# Patient Record
Sex: Female | Born: 1950
Health system: Southern US, Community
[De-identification: ages and names within clinical notes are randomized; demographics above are authoritative.]

## PROBLEM LIST (undated history)

## (undated) DIAGNOSIS — E7800 Pure hypercholesterolemia, unspecified: Secondary | ICD-10-CM

## (undated) DIAGNOSIS — E785 Hyperlipidemia, unspecified: Secondary | ICD-10-CM

## (undated) DIAGNOSIS — K589 Irritable bowel syndrome without diarrhea: Secondary | ICD-10-CM

## (undated) DIAGNOSIS — F32A Depression, unspecified: Secondary | ICD-10-CM

## (undated) DIAGNOSIS — I10 Essential (primary) hypertension: Secondary | ICD-10-CM

## (undated) DIAGNOSIS — R251 Tremor, unspecified: Secondary | ICD-10-CM

## (undated) DIAGNOSIS — E559 Vitamin D deficiency, unspecified: Secondary | ICD-10-CM

## (undated) DIAGNOSIS — N189 Chronic kidney disease, unspecified: Secondary | ICD-10-CM

## (undated) DIAGNOSIS — L719 Rosacea, unspecified: Secondary | ICD-10-CM

## (undated) DIAGNOSIS — Z86018 Personal history of other benign neoplasm: Secondary | ICD-10-CM

## (undated) DIAGNOSIS — J90 Pleural effusion, not elsewhere classified: Secondary | ICD-10-CM

## (undated) DIAGNOSIS — J45909 Unspecified asthma, uncomplicated: Secondary | ICD-10-CM

## (undated) DIAGNOSIS — F329 Major depressive disorder, single episode, unspecified: Secondary | ICD-10-CM

## (undated) DIAGNOSIS — K219 Gastro-esophageal reflux disease without esophagitis: Secondary | ICD-10-CM

## (undated) HISTORY — PX: ABDOMINAL HYSTERECTOMY: SHX81

## (undated) HISTORY — PX: GANGLION CYST EXCISION: SHX1691

## (undated) HISTORY — PX: OTHER SURGICAL HISTORY: SHX169

## (undated) HISTORY — PX: TONSILLECTOMY: SUR1361

## (undated) HISTORY — PX: HERNIA REPAIR: SHX51

---

## 2004-03-24 ENCOUNTER — Ambulatory Visit: Payer: Self-pay | Admitting: Internal Medicine

## 2004-04-05 ENCOUNTER — Ambulatory Visit: Payer: Self-pay | Admitting: Internal Medicine

## 2005-03-16 ENCOUNTER — Ambulatory Visit: Payer: Self-pay | Admitting: Gastroenterology

## 2005-04-18 ENCOUNTER — Ambulatory Visit: Payer: Self-pay | Admitting: Internal Medicine

## 2006-05-03 ENCOUNTER — Ambulatory Visit: Payer: Self-pay | Admitting: Internal Medicine

## 2006-11-06 ENCOUNTER — Ambulatory Visit: Payer: Self-pay | Admitting: Internal Medicine

## 2007-01-25 ENCOUNTER — Ambulatory Visit: Payer: Self-pay | Admitting: Family Medicine

## 2007-02-13 ENCOUNTER — Ambulatory Visit: Payer: Self-pay | Admitting: Gastroenterology

## 2007-02-16 ENCOUNTER — Emergency Department: Payer: Self-pay | Admitting: Emergency Medicine

## 2007-05-06 ENCOUNTER — Ambulatory Visit: Payer: Self-pay | Admitting: Obstetrics and Gynecology

## 2008-04-19 ENCOUNTER — Emergency Department: Payer: Self-pay | Admitting: Internal Medicine

## 2008-05-07 ENCOUNTER — Ambulatory Visit: Payer: Self-pay | Admitting: Obstetrics and Gynecology

## 2009-01-12 ENCOUNTER — Emergency Department: Payer: Self-pay | Admitting: Emergency Medicine

## 2009-05-10 ENCOUNTER — Ambulatory Visit: Payer: Self-pay | Admitting: Internal Medicine

## 2009-11-18 ENCOUNTER — Ambulatory Visit: Payer: Self-pay | Admitting: Cardiology

## 2010-03-15 ENCOUNTER — Ambulatory Visit: Payer: Self-pay | Admitting: Gastroenterology

## 2010-05-12 ENCOUNTER — Ambulatory Visit: Payer: Self-pay | Admitting: Internal Medicine

## 2010-07-27 ENCOUNTER — Emergency Department: Payer: Self-pay | Admitting: Emergency Medicine

## 2011-05-24 ENCOUNTER — Ambulatory Visit: Payer: Self-pay | Admitting: Obstetrics and Gynecology

## 2011-06-12 ENCOUNTER — Ambulatory Visit: Payer: Self-pay

## 2011-06-12 LAB — RAPID STREP-A WITH REFLX: Micro Text Report: NEGATIVE

## 2011-06-14 LAB — BETA STREP CULTURE(ARMC)

## 2011-09-08 ENCOUNTER — Ambulatory Visit: Payer: Self-pay | Admitting: Orthopaedic Surgery

## 2011-09-08 ENCOUNTER — Emergency Department: Payer: Self-pay | Admitting: *Deleted

## 2011-09-08 LAB — BASIC METABOLIC PANEL
BUN: 17 mg/dL (ref 7–18)
Calcium, Total: 10.1 mg/dL (ref 8.5–10.1)
Creatinine: 1.01 mg/dL (ref 0.60–1.30)
EGFR (African American): >60
EGFR (Non-African Amer.): >60
Glucose: 122 mg/dL — ABNORMAL HIGH (ref 65–99)
Osmolality: 282 (ref 275–301)
Sodium: 140 mmol/L (ref 136–145)

## 2011-09-08 LAB — CBC
HCT: 41 % (ref 35.0–47.0)
HGB: 13.4 g/dL (ref 12.0–16.0)
MCHC: 32.7 g/dL (ref 32.0–36.0)
MCV: 85 fL (ref 80–100)
Platelet: 203 10*3/uL (ref 150–440)
RBC: 4.81 10*6/uL (ref 3.80–5.20)
RDW: 12.6 % (ref 11.5–14.5)
WBC: 11.5 10*3/uL — ABNORMAL HIGH (ref 3.6–11.0)

## 2013-03-12 ENCOUNTER — Ambulatory Visit: Payer: Self-pay

## 2013-05-13 ENCOUNTER — Ambulatory Visit: Payer: Self-pay | Admitting: Family Medicine

## 2013-09-03 DIAGNOSIS — E785 Hyperlipidemia, unspecified: Secondary | ICD-10-CM | POA: Insufficient documentation

## 2013-09-03 DIAGNOSIS — F3289 Other specified depressive episodes: Secondary | ICD-10-CM | POA: Insufficient documentation

## 2013-09-03 DIAGNOSIS — R259 Unspecified abnormal involuntary movements: Secondary | ICD-10-CM | POA: Insufficient documentation

## 2013-09-03 DIAGNOSIS — K589 Irritable bowel syndrome without diarrhea: Secondary | ICD-10-CM | POA: Insufficient documentation

## 2013-09-03 DIAGNOSIS — E559 Vitamin D deficiency, unspecified: Secondary | ICD-10-CM | POA: Insufficient documentation

## 2014-04-24 DIAGNOSIS — J45909 Unspecified asthma, uncomplicated: Secondary | ICD-10-CM | POA: Insufficient documentation

## 2014-05-28 ENCOUNTER — Ambulatory Visit: Payer: Self-pay | Admitting: Family Medicine

## 2014-08-09 NOTE — Consult Note (Signed)
Brief Consult Note: Diagnosis: Left elbow fracture dislocation.   Patient was seen by consultant.   Comments: Patient had closed reduction of left elbow, splinting and CT scan in ED. Post reduction the patient did not have any neuro or vascular deficits, unchanged from pre-reduction.   I spoke with Dr.  Dub Mikes at [facility].  She will follow up with him by calling his office to make an appointment.  The number is [phone] or [phone]. She was instructed to bring her CT scan.  She was aslo instructed that if she has any increasing pain or numbness, tingling or decreaseed motor function to return to the ER as soon as possible.  Please see full consult in ED chart for details of the exam, etc.  Electronic Signatures: Ria Comment (MD)  (Signed 25-May-13 00:09)  Authored: Brief Consult Note   Last Updated: 25-May-13 00:09 by Ria Comment (MD)

## 2014-09-27 ENCOUNTER — Emergency Department
Admission: EM | Admit: 2014-09-27 | Discharge: 2014-09-28 | Disposition: A | Discharge status: Home / Self Care | Payer: PPO | Attending: Emergency Medicine | Admitting: Emergency Medicine

## 2014-09-27 ENCOUNTER — Encounter: Payer: Self-pay | Admitting: *Deleted

## 2014-09-27 ENCOUNTER — Emergency Department: Payer: PPO

## 2014-09-27 DIAGNOSIS — R509 Fever, unspecified: Secondary | ICD-10-CM

## 2014-09-27 DIAGNOSIS — R1013 Epigastric pain: Secondary | ICD-10-CM

## 2014-09-27 DIAGNOSIS — N3001 Acute cystitis with hematuria: Secondary | ICD-10-CM

## 2014-09-27 DIAGNOSIS — R197 Diarrhea, unspecified: Secondary | ICD-10-CM | POA: Insufficient documentation

## 2014-09-27 DIAGNOSIS — R112 Nausea with vomiting, unspecified: Secondary | ICD-10-CM | POA: Diagnosis present

## 2014-09-27 HISTORY — DX: Tremor, unspecified: R25.1

## 2014-09-27 HISTORY — DX: Unspecified asthma, uncomplicated: J45.909

## 2014-09-27 LAB — CBC WITH DIFFERENTIAL/PLATELET
BASOS ABS: 0 10*3/uL (ref 0–0.1)
Basophils Relative: 1 %
EOS ABS: 0 10*3/uL (ref 0–0.7)
EOS PCT: 0 %
HCT: 41.9 % (ref 35.0–47.0)
Hemoglobin: 13.7 g/dL (ref 12.0–16.0)
Lymphocytes Relative: 9 %
Lymphs Abs: 0.7 10*3/uL — ABNORMAL LOW (ref 1.0–3.6)
MCH: 27.3 pg (ref 26.0–34.0)
MCHC: 32.8 g/dL (ref 32.0–36.0)
MCV: 83.3 fL (ref 80.0–100.0)
Monocytes Absolute: 0.4 10*3/uL (ref 0.2–0.9)
Monocytes Relative: 6 %
Neutro Abs: 6.7 10*3/uL — ABNORMAL HIGH (ref 1.4–6.5)
Neutrophils Relative %: 84 %
PLATELETS: 172 10*3/uL (ref 150–440)
RBC: 5.03 MIL/uL (ref 3.80–5.20)
RDW: 13.4 % (ref 11.5–14.5)
WBC: 7.9 10*3/uL (ref 3.6–11.0)

## 2014-09-27 LAB — COMPREHENSIVE METABOLIC PANEL
ALT: 33 U/L (ref 14–54)
AST: 31 U/L (ref 15–41)
Albumin: 4.4 g/dL (ref 3.5–5.0)
Alkaline Phosphatase: 61 U/L (ref 38–126)
Anion gap: 8 (ref 5–15)
BILIRUBIN TOTAL: 1 mg/dL (ref 0.3–1.2)
BUN: 17 mg/dL (ref 6–20)
CHLORIDE: 105 mmol/L (ref 101–111)
CO2: 27 mmol/L (ref 22–32)
CREATININE: 1.13 mg/dL — AB (ref 0.44–1.00)
Calcium: 10.7 mg/dL — ABNORMAL HIGH (ref 8.9–10.3)
GFR calc Af Amer: 59 mL/min — ABNORMAL LOW (ref >60)
GFR calc non Af Amer: 51 mL/min — ABNORMAL LOW (ref >60)
Glucose, Bld: 155 mg/dL — ABNORMAL HIGH (ref 65–99)
POTASSIUM: 4.7 mmol/L (ref 3.5–5.1)
Sodium: 140 mmol/L (ref 135–145)
TOTAL PROTEIN: 7.1 g/dL (ref 6.5–8.1)

## 2014-09-27 LAB — URINALYSIS COMPLETE WITH MICROSCOPIC (ARMC ONLY)
BILIRUBIN URINE: NEGATIVE
Glucose, UA: NEGATIVE mg/dL
HGB URINE DIPSTICK: NEGATIVE
NITRITE: NEGATIVE
Protein, ur: 100 mg/dL — AB
Specific Gravity, Urine: 1.028 (ref 1.005–1.030)
pH: 6 (ref 5.0–8.0)

## 2014-09-27 LAB — LIPASE, BLOOD: LIPASE: 33 U/L (ref 22–51)

## 2014-09-27 MED ORDER — IOHEXOL 300 MG/ML  SOLN
100.0000 mL | Freq: Once | INTRAMUSCULAR | Status: AC | PRN
  Administered 2014-09-27: 100 mL via INTRAVENOUS

## 2014-09-27 MED ORDER — MORPHINE SULFATE 4 MG/ML IJ SOLN
INTRAMUSCULAR | Status: AC
  Administered 2014-09-27: 4 mg via INTRAVENOUS
  Filled 2014-09-27: qty 1

## 2014-09-27 MED ORDER — IOHEXOL 240 MG/ML SOLN
25.0000 mL | Freq: Once | INTRAMUSCULAR | Status: AC | PRN
  Administered 2014-09-27: 25 mL via ORAL

## 2014-09-27 MED ORDER — ONDANSETRON HCL 4 MG/2ML IJ SOLN
4.0000 mg | Freq: Once | INTRAMUSCULAR | Status: AC
  Administered 2014-09-27: 4 mg via INTRAVENOUS

## 2014-09-27 MED ORDER — SODIUM CHLORIDE 0.9 % IV BOLUS (SEPSIS)
1000.0000 mL | Freq: Once | INTRAVENOUS | Status: AC
Start: 2014-09-27 — End: 2014-09-28
  Administered 2014-09-27: 1000 mL via INTRAVENOUS

## 2014-09-27 MED ORDER — MORPHINE SULFATE 4 MG/ML IJ SOLN
4.0000 mg | Freq: Once | INTRAMUSCULAR | Status: AC
  Administered 2014-09-27: 4 mg via INTRAVENOUS

## 2014-09-27 MED ORDER — ONDANSETRON HCL 4 MG/2ML IJ SOLN
INTRAMUSCULAR | Status: AC
  Administered 2014-09-27: 4 mg via INTRAVENOUS
  Filled 2014-09-27: qty 2

## 2014-09-27 NOTE — ED Notes (Signed)
Pt has abd pain with vomiting and diarrhea.  Sx began last night. Pt alert.  Pale.

## 2014-09-27 NOTE — ED Notes (Signed)
Patient transported to CT 

## 2014-09-27 NOTE — ED Provider Notes (Signed)
[facility] Emergency Department Provider Note  Time seen: 10:17 PM  I have reviewed the triage vital signs and the nursing notes.   HISTORY  Chief Complaint Emesis    HPI Christina Perez is a 64 y.o. female who presents to the emergency department with nausea, vomiting, and upper abdominal pain since last night. According to the patient she has had upper abdominal pain, nausea, vomiting and some loose stool since last night. Denies any bloody or black stool or vomit. Denies fever, denies dysuria. Patient states subjective fever at home, but did not measure a temperature. Patient describes her abdominal pain is moderate, dull/aching, exacerbated with vomiting. Patient has not attempted to eat, but unable to take in fluids due to nausea/vomiting.     History reviewed. No pertinent past medical history.  There are no active problems to display for this patient.   No past surgical history on file.  No current outpatient prescriptions on file.  Allergies Cephalosporins and Sulfa antibiotics  No family history on file.  Social History History  Substance Use Topics  . Smoking status: Never Smoker   . Smokeless tobacco: Not on file  . Alcohol Use: No    Review of Systems Constitutional: Negative for fever. Cardiovascular: Negative for chest pain. Respiratory: Negative for shortness of breath. Gastrointestinal: Positive upper abdominal pain, nausea, vomiting, diarrhea. Genitourinary: Negative for dysuria. Skin: Negative for rash. Neurological: Negative for headache  10-point ROS otherwise negative.  ____________________________________________   PHYSICAL EXAM:  VITAL SIGNS: ED Triage Vitals  Enc Vitals Group     BP 09/27/14 2024 109/68 mmHg     Pulse Rate 09/27/14 2024 95     Resp 09/27/14 2024 20     Temp 09/27/14 2024 100.7 F (38.2 C)     Temp Source 09/27/14 2024 Oral     SpO2 09/27/14 2024 97 %     Weight 09/27/14 2024 182 lb  (82.555 kg)     Height 09/27/14 2024 5\' 5"  (1.651 m)     Head Cir --      Peak Flow --      Pain Score 09/27/14 2026 10     Pain Loc --      Pain Edu? --      Excl. in [place]? --     Constitutional: Alert and oriented. Well appearing and in no distress. ENT   Head: Normocephalic and atraumatic.   Mouth/Throat: Mucous membranes are moist. Cardiovascular: Normal rate, regular rhythm. No murmur Respiratory: Normal respiratory effort without tachypnea nor retractions. Breath sounds are clear  Gastrointestinal: Soft, moderate epigastric tenderness to palpation. No right upper quadrant tenderness to palpation. No rebound or guarding, abdomen otherwise benign. No CVA tenderness palpation. Musculoskeletal: Nontender with normal range of motion in all extremities. Neurologic:  Normal speech and language. No gross focal neurologic deficits  Skin:  Skin is warm, dry and intact.  Psychiatric: Mood and affect are normal. Speech and behavior are normal.   ____________________________________________    RADIOLOGY  CT pending  ____________________________________________    INITIAL IMPRESSION / ASSESSMENT AND PLAN / ED COURSE  Pertinent labs & imaging results that were available during my care of the patient were reviewed by me and considered in my medical decision making (see chart for details).  Patient with upper abdominal pain, nausea, vomiting since last night. Mild/moderate epigastric tenderness palpation on exam. Temperature 100.7, vitals otherwise within normal limits. Labs are largely within normal limits, mild creatinine elevation likely due to decreased  fluid intake. Currently awaiting urinalysis. We will proceed with a CT scan to further evaluate given the patient's temperature and abdominal pain. We will treat pain, nausea, and IV hydrate in the meantime.  CT and urinalysis pending, patient care signed out to Dr. Dahlia Client. ____________________________________________   FINAL  CLINICAL IMPRESSION(S) / ED DIAGNOSES  Epigastric abdominal pain Fever Nausea and vomiting   Harvest Dark, MD 09/27/14 2318

## 2014-09-27 NOTE — ED Notes (Addendum)
Pt to ED from home c/o abd pain and n/v/d since last night.  Pt states abd pain to epigastric area tender to palpation described as "it just hurts".  Pt reports diarrhea starting around same time as nausea at 2300 last night with around 20 vomiting episodes and 15 times with diarrhea.  (+) gas.  Pt denies urinary symptoms or burning.  Pt lungs clear x4, VSS, A&Ox4, speaking in complete and coherent sentences and in NAD at this time.

## 2014-09-28 MED ORDER — LEVOFLOXACIN 750 MG PO TABS: 750.0000 mg | ORAL_TABLET | Freq: Every day | ORAL | Status: AC

## 2014-09-28 MED ORDER — LEVOFLOXACIN IN D5W 750 MG/150ML IV SOLN: 750.0000 mg | Freq: Once | INTRAVENOUS | Status: DC

## 2014-09-28 MED ORDER — LEVOFLOXACIN IN D5W 750 MG/150ML IV SOLN
INTRAVENOUS | Status: AC
  Filled 2014-09-28: qty 150

## 2014-09-28 MED ORDER — ONDANSETRON 4 MG PO TBDP: 4.0000 mg | ORAL_TABLET | Freq: Three times a day (TID) | ORAL | Status: DC | PRN

## 2014-09-28 NOTE — ED Provider Notes (Signed)
-----------------------------------------   2:51 AM on 09/28/2014 -----------------------------------------  Assuming care from Dr. Kerman Passey.  In short, Christina Perez is a 64 y.o. female with a chief complaint of Emesis .  Refer to the original H&P for additional details.  The current plan of care is to follow-up the results of the urinalysis and the CT scan.  The urinalysis shows too numerous to count white blood cells and rare bacteria, are 6-30 red blood cells per high power field.  CT scan: 3 small nodules in the left lung base, no acute process demonstrated within the abdomen and pelvis. No evidence of bowel obstruction or inflammation.  I did give the patient a dose of level floxacillin while in the emergency department as the patient has a sulfa and cephalosporin allergy. The patient will be discharged home to follow-up with her primary care physician for UTI. She has not had any further emesis while in the emergency department.   Loney Hering, MD 09/28/14 [phone]

## 2014-09-28 NOTE — ED Notes (Signed)
Pt on med. Hold.

## 2014-09-28 NOTE — Discharge Instructions (Signed)
Abdominal Pain Many things can cause abdominal pain. Usually, abdominal pain is not caused by a disease and will improve without treatment. It can often be observed and treated at home. Your health care provider will do a physical exam and possibly order blood tests and X-rays to help determine the seriousness of your pain. However, in many cases, more time must pass before a clear cause of the pain can be found. Before that point, your health care provider may not know if you need more testing or further treatment. HOME CARE INSTRUCTIONS  Monitor your abdominal pain for any changes. The following actions may help to alleviate any discomfort you are experiencing:  Only take over-the-counter or prescription medicines as directed by your health care provider.  Do not take laxatives unless directed to do so by your health care provider.  Try a clear liquid diet (broth, tea, or water) as directed by your health care provider. Slowly move to a bland diet as tolerated. SEEK MEDICAL CARE IF:  You have unexplained abdominal pain.  You have abdominal pain associated with nausea or diarrhea.  You have pain when you urinate or have a bowel movement.  You experience abdominal pain that wakes you in the night.  You have abdominal pain that is worsened or improved by eating food.  You have abdominal pain that is worsened with eating fatty foods.  You have a fever. SEEK IMMEDIATE MEDICAL CARE IF:   Your pain does not go away within 2 hours.  You keep throwing up (vomiting).  Your pain is felt only in portions of the abdomen, such as the right side or the left lower portion of the abdomen.  You pass bloody or black tarry stools. MAKE SURE YOU:  Understand these instructions.   Will watch your condition.   Will get help right away if you are not doing well or get worse.  Document Released: 01/11/2005 Document Revised: 04/08/2013 Document Reviewed: 12/11/2012 [facility] Patient Information  2015 [place], [place]. This information is not intended to replace advice given to you by your health care provider. Make sure you discuss any questions you have with your health care provider.  Fever, Adult A fever is a higher than normal body temperature. In an adult, an oral temperature around 98.6 F (37 C) is considered normal. A temperature of 100.4 F (38 C) or higher is generally considered a fever. Mild or moderate fevers generally have no long-term effects and often do not require treatment. Extreme fever (greater than or equal to 106 F or 41.1 C) can cause seizures. The sweating that may occur with repeated or prolonged fever may cause dehydration. Elderly people can develop confusion during a fever. A measured temperature can vary with:  Age.  Time of day.  Method of measurement (mouth, underarm, rectal, or ear). The fever is confirmed by taking a temperature with a thermometer. Temperatures can be taken different ways. Some methods are accurate and some are not.  An oral temperature is used most commonly. Electronic thermometers are fast and accurate.  An ear temperature will only be accurate if the thermometer is positioned as recommended by the manufacturer.  A rectal temperature is accurate and done for those adults who have a condition where an oral temperature cannot be taken.  An underarm (axillary) temperature is not accurate and not recommended. Fever is a symptom, not a disease.  CAUSES   Infections commonly cause fever.  Some noninfectious causes for fever include:  Some arthritis conditions.  Some  thyroid or adrenal gland conditions.  Some immune system conditions.  Some types of cancer.  A medicine reaction.  High doses of certain street drugs such as methamphetamine.  Dehydration.  Exposure to high outside or room temperatures.  Occasionally, the source of a fever cannot be determined. This is sometimes called a "fever of unknown origin"  (FUO).  Some situations may lead to a temporary rise in body temperature that may go away on its own. Examples are:  Childbirth.  Surgery.  Intense exercise. HOME CARE INSTRUCTIONS   Take appropriate medicines for fever. Follow dosing instructions carefully. If you use acetaminophen to reduce the fever, be careful to avoid taking other medicines that also contain acetaminophen. Do not take aspirin for a fever if you are younger than age 35. There is an association with Reye's syndrome. Reye's syndrome is a rare but potentially deadly disease.  If an infection is present and antibiotics have been prescribed, take them as directed. Finish them even if you start to feel better.  Rest as needed.  Maintain an adequate fluid intake. To prevent dehydration during an illness with prolonged or recurrent fever, you may need to drink extra fluid.Drink enough fluids to keep your urine clear or pale yellow.  Sponging or bathing with room temperature water may help reduce body temperature. Do not use ice water or alcohol sponge baths.  Dress comfortably, but do not over-bundle. SEEK MEDICAL CARE IF:   You are unable to keep fluids down.  You develop vomiting or diarrhea.  You are not feeling at least partly better after 3 days.  You develop new symptoms or problems. SEEK IMMEDIATE MEDICAL CARE IF:   You have shortness of breath or trouble breathing.  You develop excessive weakness.  You are dizzy or you faint.  You are extremely thirsty or you are making little or no urine.  You develop new pain that was not there before (such as in the head, neck, chest, back, or abdomen).  You have persistent vomiting and diarrhea for more than 1 to 2 days.  You develop a stiff neck or your eyes become sensitive to light.  You develop a skin rash.  You have a fever or persistent symptoms for more than 2 to 3 days.  You have a fever and your symptoms suddenly get worse. MAKE SURE YOU:    Understand these instructions.  Will watch your condition.  Will get help right away if you are not doing well or get worse. Document Released: 09/27/2000 Document Revised: 08/18/2013 Document Reviewed: 02/02/2011 [facility] Patient Information 2015 [place], [place]. This information is not intended to replace advice given to you by your health care provider. Make sure you discuss any questions you have with your health care provider.  Urinary Tract Infection Urinary tract infections (UTIs) can develop anywhere along your urinary tract. Your urinary tract is your body's drainage system for removing wastes and extra water. Your urinary tract includes two kidneys, two ureters, a bladder, and a urethra. Your kidneys are a pair of bean-shaped organs. Each kidney is about the size of your fist. They are located below your ribs, one on each side of your spine. CAUSES Infections are caused by microbes, which are microscopic organisms, including fungi, viruses, and bacteria. These organisms are so small that they can only be seen through a microscope. Bacteria are the microbes that most commonly cause UTIs. SYMPTOMS  Symptoms of UTIs may vary by age and gender of the patient and by the location  of the infection. Symptoms in young women typically include a frequent and intense urge to urinate and a painful, burning feeling in the bladder or urethra during urination. Older women and men are more likely to be tired, shaky, and weak and have muscle aches and abdominal pain. A fever may mean the infection is in your kidneys. Other symptoms of a kidney infection include pain in your back or sides below the ribs, nausea, and vomiting. DIAGNOSIS To diagnose a UTI, your caregiver will ask you about your symptoms. Your caregiver also will ask to provide a urine sample. The urine sample will be tested for bacteria and white blood cells. White blood cells are made by your body to help fight infection. TREATMENT   Typically, UTIs can be treated with medication. Because most UTIs are caused by a bacterial infection, they usually can be treated with the use of antibiotics. The choice of antibiotic and length of treatment depend on your symptoms and the type of bacteria causing your infection. HOME CARE INSTRUCTIONS  If you were prescribed antibiotics, take them exactly as your caregiver instructs you. Finish the medication even if you feel better after you have only taken some of the medication.  Drink enough water and fluids to keep your urine clear or pale yellow.  Avoid caffeine, tea, and carbonated beverages. They tend to irritate your bladder.  Empty your bladder often. Avoid holding urine for long periods of time.  Empty your bladder before and after sexual intercourse.  After a bowel movement, women should cleanse from front to back. Use each tissue only once. SEEK MEDICAL CARE IF:   You have back pain.  You develop a fever.  Your symptoms do not begin to resolve within 3 days. SEEK IMMEDIATE MEDICAL CARE IF:   You have severe back pain or lower abdominal pain.  You develop chills.  You have nausea or vomiting.  You have continued burning or discomfort with urination. MAKE SURE YOU:   Understand these instructions.  Will watch your condition.  Will get help right away if you are not doing well or get worse. Document Released: 01/11/2005 Document Revised: 10/03/2011 Document Reviewed: 05/12/2011 [facility] Patient Information 2015 [place], [place]. This information is not intended to replace advice given to you by your health care provider. Make sure you discuss any questions you have with your health care provider.

## 2015-02-19 ENCOUNTER — Emergency Department
Admission: EM | Admit: 2015-02-19 | Discharge: 2015-02-19 | Disposition: A | Discharge status: Home / Self Care | Payer: PPO | Attending: Student | Admitting: Student

## 2015-02-19 ENCOUNTER — Emergency Department: Payer: PPO

## 2015-02-19 ENCOUNTER — Encounter: Payer: Self-pay | Admitting: *Deleted

## 2015-02-19 DIAGNOSIS — R319 Hematuria, unspecified: Secondary | ICD-10-CM | POA: Diagnosis present

## 2015-02-19 DIAGNOSIS — Z7982 Long term (current) use of aspirin: Secondary | ICD-10-CM | POA: Insufficient documentation

## 2015-02-19 DIAGNOSIS — E119 Type 2 diabetes mellitus without complications: Secondary | ICD-10-CM | POA: Insufficient documentation

## 2015-02-19 DIAGNOSIS — I10 Essential (primary) hypertension: Secondary | ICD-10-CM | POA: Diagnosis not present

## 2015-02-19 DIAGNOSIS — Z79899 Other long term (current) drug therapy: Secondary | ICD-10-CM | POA: Diagnosis not present

## 2015-02-19 DIAGNOSIS — N390 Urinary tract infection, site not specified: Secondary | ICD-10-CM | POA: Insufficient documentation

## 2015-02-19 LAB — CBC WITH DIFFERENTIAL/PLATELET
Basophils Absolute: 0.1 10*3/uL (ref 0–0.1)
Basophils Relative: 1 %
Eosinophils Absolute: 0.2 10*3/uL (ref 0–0.7)
Eosinophils Relative: 2 %
HEMATOCRIT: 40.1 % (ref 35.0–47.0)
Hemoglobin: 13.4 g/dL (ref 12.0–16.0)
LYMPHS ABS: 2 10*3/uL (ref 1.0–3.6)
LYMPHS PCT: 18 %
MCH: 27.6 pg (ref 26.0–34.0)
MCHC: 33.5 g/dL (ref 32.0–36.0)
MCV: 82.4 fL (ref 80.0–100.0)
MONO ABS: 0.9 10*3/uL (ref 0.2–0.9)
MONOS PCT: 8 %
NEUTROS ABS: 7.9 10*3/uL — AB (ref 1.4–6.5)
Neutrophils Relative %: 71 %
Platelets: 184 10*3/uL (ref 150–440)
RBC: 4.86 MIL/uL (ref 3.80–5.20)
RDW: 13.3 % (ref 11.5–14.5)
WBC: 11 10*3/uL (ref 3.6–11.0)

## 2015-02-19 LAB — URINALYSIS COMPLETE WITH MICROSCOPIC (ARMC ONLY)
BILIRUBIN URINE: NEGATIVE
Bacteria, UA: NONE SEEN
GLUCOSE, UA: NEGATIVE mg/dL
KETONES UR: NEGATIVE mg/dL
NITRITE: NEGATIVE
Protein, ur: 100 mg/dL — AB
Specific Gravity, Urine: 1.016 (ref 1.005–1.030)
pH: 8 (ref 5.0–8.0)

## 2015-02-19 LAB — COMPREHENSIVE METABOLIC PANEL
ALBUMIN: 4.5 g/dL (ref 3.5–5.0)
ALT: 35 U/L (ref 14–54)
ANION GAP: 5 (ref 5–15)
AST: 25 U/L (ref 15–41)
Alkaline Phosphatase: 69 U/L (ref 38–126)
BILIRUBIN TOTAL: 0.8 mg/dL (ref 0.3–1.2)
BUN: 11 mg/dL (ref 6–20)
CO2: 28 mmol/L (ref 22–32)
Calcium: 10.5 mg/dL — ABNORMAL HIGH (ref 8.9–10.3)
Chloride: 108 mmol/L (ref 101–111)
Creatinine, Ser: 1.13 mg/dL — ABNORMAL HIGH (ref 0.44–1.00)
GFR calc Af Amer: 59 mL/min — ABNORMAL LOW (ref >60)
GFR calc non Af Amer: 51 mL/min — ABNORMAL LOW (ref >60)
Glucose, Bld: 110 mg/dL — ABNORMAL HIGH (ref 65–99)
POTASSIUM: 4.1 mmol/L (ref 3.5–5.1)
Sodium: 141 mmol/L (ref 135–145)
TOTAL PROTEIN: 7.1 g/dL (ref 6.5–8.1)

## 2015-02-19 LAB — LIPASE, BLOOD: LIPASE: 38 U/L (ref 11–51)

## 2015-02-19 MED ORDER — CIPROFLOXACIN HCL 500 MG PO TABS
500.0000 mg | ORAL_TABLET | Freq: Once | ORAL | Status: AC
  Administered 2015-02-19: 500 mg via ORAL
  Filled 2015-02-19: qty 1

## 2015-02-19 MED ORDER — IOHEXOL 300 MG/ML  SOLN
125.0000 mL | Freq: Once | INTRAMUSCULAR | Status: AC | PRN
  Administered 2015-02-19: 125 mL via INTRAVENOUS

## 2015-02-19 MED ORDER — ONDANSETRON HCL 4 MG/2ML IJ SOLN
4.0000 mg | Freq: Once | INTRAMUSCULAR | Status: DC
  Filled 2015-02-19: qty 2

## 2015-02-19 MED ORDER — CIPROFLOXACIN HCL 500 MG PO TABS: 500.0000 mg | ORAL_TABLET | Freq: Two times a day (BID) | ORAL | Status: AC

## 2015-02-19 MED ORDER — NITROFURANTOIN MONOHYD MACRO 100 MG PO CAPS: 100.0000 mg | ORAL_CAPSULE | Freq: Once | ORAL | Status: DC

## 2015-02-19 MED ORDER — MORPHINE SULFATE (PF) 4 MG/ML IV SOLN: 4.0000 mg | Freq: Once | INTRAVENOUS | Status: DC

## 2015-02-19 MED ORDER — ONDANSETRON HCL 4 MG/2ML IJ SOLN
4.0000 mg | Freq: Once | INTRAMUSCULAR | Status: AC
  Administered 2015-02-19: 4 mg via INTRAVENOUS

## 2015-02-19 MED ORDER — SODIUM CHLORIDE 0.9 % IV BOLUS (SEPSIS)
500.0000 mL | Freq: Once | INTRAVENOUS | Status: AC
  Administered 2015-02-19: 500 mL via INTRAVENOUS

## 2015-02-19 MED ORDER — MORPHINE SULFATE (PF) 4 MG/ML IV SOLN
4.0000 mg | Freq: Once | INTRAVENOUS | Status: AC
  Administered 2015-02-19: 4 mg via INTRAVENOUS
  Filled 2015-02-19: qty 1

## 2015-02-19 NOTE — ED Provider Notes (Signed)
[facility] Emergency Department Provider Note  ____________________________________________  Time seen: Approximately 8:19 PM  I have reviewed the triage vital signs and the nursing notes.   HISTORY  Chief Complaint Hematuria    HPI Christina Perez is a 64 y.o. female with history of CHF, diabetes, hypertension, asthma, tremor who presents for evaluation of hematuria, gradual onset yesterday, constant since onset, worsening today. She is also complaining of diffuse lower abdominal pain. No fevers or chills. No vomiting or diarrhea. No chest pain or different breathing. No modifying factors.   Past Medical History  Diagnosis Date  . Asthma   . Tremor     There are no active problems to display for this patient.   Past Surgical History  Procedure Laterality Date  . Abdominal hysterectomy    . Tonsillectomy    . Hernia repair      Current Outpatient Rx  Name  Route  Sig  Dispense  Refill  . aspirin 81 MG tablet   Oral   Take 81 mg by mouth daily.         . ondansetron (ZOFRAN ODT) 4 MG disintegrating tablet   Oral   Take 1 tablet (4 mg total) by mouth every 8 (eight) hours as needed for nausea or vomiting.   20 tablet   0   . propranolol (INDERAL) 80 MG tablet   Oral   Take 80 mg by mouth 3 (three) times daily.           Allergies Cephalosporins and Sulfa antibiotics  History reviewed. No pertinent family history.  Social History Social History  Substance Use Topics  . Smoking status: Never Smoker   . Smokeless tobacco: None  . Alcohol Use: No    Review of Systems Constitutional: No fever/chills Eyes: No visual changes. ENT: No sore throat. Cardiovascular: Denies chest pain. Respiratory: Denies shortness of breath. Gastrointestinal: + abdominal pain.  No nausea, no vomiting.  No diarrhea.  No constipation. Genitourinary: Negative for dysuria. Musculoskeletal: Negative for back pain. Skin: Negative for  rash. Neurological: Negative for headaches, focal weakness or numbness.  10-point ROS otherwise negative.  ____________________________________________   PHYSICAL EXAM:  VITAL SIGNS: ED Triage Vitals  Enc Vitals Group     BP 02/19/15 1938 130/57 mmHg     Pulse Rate 02/19/15 1938 58     Resp 02/19/15 1938 18     Temp 02/19/15 1938 98.8 F (37.1 C)     Temp Source 02/19/15 1938 Oral     SpO2 02/19/15 1938 99 %     Weight 02/19/15 1938 180 lb (81.647 kg)     Height 02/19/15 1938 5\' 5"  (1.651 m)     Head Cir --      Peak Flow --      Pain Score 02/19/15 1939 6     Pain Loc --      Pain Edu? --      Excl. in [place]? --     Constitutional: Alert and oriented. Well appearing and in no acute distress. Eyes: Conjunctivae are normal. PERRL. EOMI. Head: Atraumatic. Nose: No congestion/rhinnorhea. Mouth/Throat: Mucous membranes are moist.  Oropharynx non-erythematous. Neck: No stridor.   Cardiovascular: Normal rate, regular rhythm. Grossly normal heart sounds.  Good peripheral circulation. Respiratory: Normal respiratory effort.  No retractions. Lungs CTAB. Gastrointestinal: Diffuse lower abdominal tenderness with suprapubic firmness. No CVA tenderness. Genitourinary: External genitalia examined with nurse Kendall at bedside, no gross hematuria from the urethra and no vaginal bleeding.  Musculoskeletal: No lower extremity tenderness nor edema.  No joint effusions. Neurologic:  Normal speech and language. No gross focal neurologic deficits are appreciated. No gait instability. Skin:  Skin is warm, dry and intact. No rash noted. Psychiatric: Mood and affect are normal. Speech and behavior are normal.  ____________________________________________   LABS (all labs ordered are listed, but only abnormal results are displayed)  Labs Reviewed  URINALYSIS COMPLETEWITH MICROSCOPIC ([facility] ONLY) - Abnormal; Notable for the following:    Color, Urine YELLOW (*)    APPearance CLOUDY (*)    Hgb  urine dipstick 3+ (*)    Protein, ur 100 (*)    Leukocytes, UA 3+ (*)    Squamous Epithelial / LPF 0-5 (*)    All other components within normal limits  CBC WITH DIFFERENTIAL/PLATELET - Abnormal; Notable for the following:    Neutro Abs 7.9 (*)    All other components within normal limits  COMPREHENSIVE METABOLIC PANEL - Abnormal; Notable for the following:    Glucose, Bld 110 (*)    Creatinine, Ser 1.13 (*)    Calcium 10.5 (*)    GFR calc non Af Amer 51 (*)    GFR calc Af Amer 59 (*)    All other components within normal limits  LIPASE, BLOOD   ____________________________________________  EKG  none ____________________________________________  RADIOLOGY  CT abdomen FINDINGS: The precontrast portion of the examination is negative for urinary calculus. Following administration of intravenous contrast, the scan was repeated. Multiphasic post-contrast scans demonstrate normal appearances of the liver, gallbladder, pancreas, spleen, adrenals and kidneys. Urinary bladder is unremarkable. The abdominal aorta is normal in caliber. There is mild atherosclerotic calcification. There is no adenopathy in the abdomen or pelvis. There are normal appearances of the stomach, small bowel and colon. The appendix is normal. There is prior hysterectomy. No adnexal abnormalities.  There is a small pericardial effusion, incompletely imaged but probably not significantly different from 09/27/2014. Minimal benign-appearing nodularity is also unchanged in the lung bases. Stable linear scarring again noted in the bases.  IMPRESSION: 1. Etiology of hematuria not identified. No significant abnormality is evident in the abdomen or pelvis. 2. Small incompletely imaged pericardial effusion. Mild benign appearing nodularity in the lung bases, 3 mm or less. If the patient is at high risk for bronchogenic carcinoma, follow-up chest CT at 1 year is recommended. If the patient is at low risk, no  follow-up is needed. This recommendation follows the consensus statement: Guidelines for Management of Small Pulmonary Nodules Detected on CT Scans: A Statement from the [employer] as published in Radiology 2005; 237:395-400.  ____________________________________________   PROCEDURES  Procedure(s) performed: None  Critical Care performed: No  ____________________________________________   INITIAL IMPRESSION / ASSESSMENT AND PLAN / ED COURSE  Pertinent labs & imaging results that were available during my care of the patient were reviewed by me and considered in my medical decision making (see chart for details).  Christina Perez is a 64 y.o. female with history of CHF, diabetes, hypertension, asthma, tremor, s/p total hysterectomy who presents for evaluation of hematuria and lower abdominal pain. On exam, she is generally nontoxic appearing and in no acute distress but she does have diffuse lower abdominal tenderness as well as some suprapubic firmness/distention. We'll obtain abdominal pain labs, treat her pain, return urinalysis and obtain CT of the abdomen and pelvis to evaluate for any evidence of malignancy/tumor.  ----------------------------------------- 10:41 PM on 02/19/2015 ----------------------------------------- Patient with significant improvement in her pain at this  time. Labs reviewed. CMP notable for very mild creatinine elevation at 1.13, fluids given. CBC unremarkable. Normal lipase. Urinalysis is consistent with urinary tract infection. CT scan shows no significant normality in the abdomen or pelvis/chronic findings when compared to CT scan earlier this year. Patient with no abdominal tenderness/firmness at this time. Discussed return precautions, need for close PCP follow-up, treatment with ciprofloxacin given allergies to sulfa and cephalosporins and she is comfortable with discharge plan.  ____________________________________________   FINAL CLINICAL  IMPRESSION(S) / ED DIAGNOSES  Final diagnoses:  Hematuria  UTI (lower urinary tract infection)      Joanne Gavel, MD 02/19/15 2243

## 2015-02-19 NOTE — ED Notes (Signed)
Edd Fabian, MD and RN at bedside.

## 2015-02-19 NOTE — ED Notes (Signed)
Pt reports hematuria and abdominal pain that started yesterday.  States that now she is bleeding from her urethra continuously and is changing her pad 2-3x an hour.  Pt reports that she is passing small clots.   Pt appears weak and pale.

## 2015-04-22 DIAGNOSIS — K219 Gastro-esophageal reflux disease without esophagitis: Secondary | ICD-10-CM | POA: Diagnosis not present

## 2015-04-22 DIAGNOSIS — E7800 Pure hypercholesterolemia, unspecified: Secondary | ICD-10-CM | POA: Diagnosis not present

## 2015-04-22 DIAGNOSIS — R739 Hyperglycemia, unspecified: Secondary | ICD-10-CM | POA: Diagnosis not present

## 2015-04-22 DIAGNOSIS — Z79899 Other long term (current) drug therapy: Secondary | ICD-10-CM | POA: Diagnosis not present

## 2015-04-22 DIAGNOSIS — G250 Essential tremor: Secondary | ICD-10-CM | POA: Diagnosis not present

## 2015-04-22 DIAGNOSIS — J4520 Mild intermittent asthma, uncomplicated: Secondary | ICD-10-CM | POA: Diagnosis not present

## 2015-04-22 DIAGNOSIS — J302 Other seasonal allergic rhinitis: Secondary | ICD-10-CM | POA: Diagnosis not present

## 2015-04-23 DIAGNOSIS — E7800 Pure hypercholesterolemia, unspecified: Secondary | ICD-10-CM | POA: Diagnosis not present

## 2015-04-23 DIAGNOSIS — Z79899 Other long term (current) drug therapy: Secondary | ICD-10-CM | POA: Diagnosis not present

## 2015-04-23 DIAGNOSIS — R739 Hyperglycemia, unspecified: Secondary | ICD-10-CM | POA: Diagnosis not present

## 2015-07-06 ENCOUNTER — Emergency Department: Payer: PPO

## 2015-07-06 ENCOUNTER — Encounter: Payer: Self-pay | Admitting: *Deleted

## 2015-07-06 DIAGNOSIS — R05 Cough: Secondary | ICD-10-CM | POA: Diagnosis not present

## 2015-07-06 DIAGNOSIS — Z79899 Other long term (current) drug therapy: Secondary | ICD-10-CM | POA: Insufficient documentation

## 2015-07-06 DIAGNOSIS — J40 Bronchitis, not specified as acute or chronic: Secondary | ICD-10-CM | POA: Diagnosis not present

## 2015-07-06 DIAGNOSIS — J45901 Unspecified asthma with (acute) exacerbation: Secondary | ICD-10-CM | POA: Diagnosis not present

## 2015-07-06 DIAGNOSIS — Z7982 Long term (current) use of aspirin: Secondary | ICD-10-CM | POA: Diagnosis not present

## 2015-07-06 DIAGNOSIS — R0789 Other chest pain: Secondary | ICD-10-CM | POA: Diagnosis not present

## 2015-07-06 DIAGNOSIS — R0602 Shortness of breath: Secondary | ICD-10-CM | POA: Diagnosis not present

## 2015-07-06 LAB — CBC
HCT: 42 % (ref 35.0–47.0)
HEMOGLOBIN: 14.1 g/dL (ref 12.0–16.0)
MCH: 27.3 pg (ref 26.0–34.0)
MCHC: 33.5 g/dL (ref 32.0–36.0)
MCV: 81.5 fL (ref 80.0–100.0)
Platelets: 203 10*3/uL (ref 150–440)
RBC: 5.16 MIL/uL (ref 3.80–5.20)
RDW: 13.8 % (ref 11.5–14.5)
WBC: 6.7 10*3/uL (ref 3.6–11.0)

## 2015-07-06 LAB — BASIC METABOLIC PANEL
ANION GAP: 8 (ref 5–15)
BUN: 16 mg/dL (ref 6–20)
CO2: 24 mmol/L (ref 22–32)
Calcium: 11 mg/dL — ABNORMAL HIGH (ref 8.9–10.3)
Chloride: 105 mmol/L (ref 101–111)
Creatinine, Ser: 1.12 mg/dL — ABNORMAL HIGH (ref 0.44–1.00)
GFR calc Af Amer: 59 mL/min — ABNORMAL LOW (ref >60)
GFR, EST NON AFRICAN AMERICAN: 51 mL/min — AB (ref >60)
Glucose, Bld: 98 mg/dL (ref 65–99)
POTASSIUM: 3.7 mmol/L (ref 3.5–5.1)
SODIUM: 137 mmol/L (ref 135–145)

## 2015-07-06 LAB — TROPONIN I

## 2015-07-06 NOTE — ED Notes (Signed)
Pt to triage via wheelchair.  Pt reports she has cold sx and husband was dx with flu.  Today pt reports sob with chest heaviness.  Nonproductive cough.  Nonsmoker.  No n/v/d  No diaphoresis.  Pt alert.  Speech clear.

## 2015-07-07 ENCOUNTER — Emergency Department
Admission: EM | Admit: 2015-07-07 | Discharge: 2015-07-07 | Disposition: A | Discharge status: Home / Self Care | Payer: PPO | Attending: Emergency Medicine | Admitting: Emergency Medicine

## 2015-07-07 DIAGNOSIS — J40 Bronchitis, not specified as acute or chronic: Secondary | ICD-10-CM

## 2015-07-07 MED ORDER — HYDROCOD POLST-CPM POLST ER 10-8 MG/5ML PO SUER: 5.0000 mL | Freq: Two times a day (BID) | ORAL | Status: DC

## 2015-07-07 MED ORDER — PREDNISONE 20 MG PO TABS: ORAL_TABLET | ORAL | Status: DC

## 2015-07-07 MED ORDER — HYDROCOD POLST-CPM POLST ER 10-8 MG/5ML PO SUER
5.0000 mL | Freq: Once | ORAL | Status: AC
  Administered 2015-07-07: 5 mL via ORAL
  Filled 2015-07-07: qty 5

## 2015-07-07 NOTE — Discharge Instructions (Signed)
1. Take steroid as prescribed (Prednisone 60mg  daily x 5 days). 2. Use cough medicine as needed (Tussionex). 3. Return to the ER for worsening symptoms, persistent vomiting, difficulty breathing or other concerns.   Upper Respiratory Infection, Adult Most upper respiratory infections (URIs) are a viral infection of the air passages leading to the lungs. A URI affects the nose, throat, and upper air passages. The most common type of URI is nasopharyngitis and is typically referred to as "the common cold." URIs run their course and usually go away on their own. Most of the time, a URI does not require medical attention, but sometimes a bacterial infection in the upper airways can follow a viral infection. This is called a secondary infection. Sinus and middle ear infections are common types of secondary upper respiratory infections. Bacterial pneumonia can also complicate a URI. A URI can worsen asthma and chronic obstructive pulmonary disease (COPD). Sometimes, these complications can require emergency medical care and may be life threatening.  CAUSES Almost all URIs are caused by viruses. A virus is a type of germ and can spread from one person to another.  RISKS FACTORS You may be at risk for a URI if:   You smoke.   You have chronic heart or lung disease.  You have a weakened defense (immune) system.   You are very young or very old.   You have nasal allergies or asthma.  You work in crowded or poorly ventilated areas.  You work in health care facilities or schools. SIGNS AND SYMPTOMS  Symptoms typically develop 2-3 days after you come in contact with a cold virus. Most viral URIs last 7-10 days. However, viral URIs from the influenza virus (flu virus) can last 14-18 days and are typically more severe. Symptoms may include:   Runny or stuffy (congested) nose.   Sneezing.   Cough.   Sore throat.   Headache.   Fatigue.   Fever.   Loss of appetite.   Pain in  your forehead, behind your eyes, and over your cheekbones (sinus pain).  Muscle aches.  DIAGNOSIS  Your health care provider may diagnose a URI by:  Physical exam.  Tests to check that your symptoms are not due to another condition such as:  Strep throat.  Sinusitis.  Pneumonia.  Asthma. TREATMENT  A URI goes away on its own with time. It cannot be cured with medicines, but medicines may be prescribed or recommended to relieve symptoms. Medicines may help:  Reduce your fever.  Reduce your cough.  Relieve nasal congestion. HOME CARE INSTRUCTIONS   Take medicines only as directed by your health care provider.   Gargle warm saltwater or take cough drops to comfort your throat as directed by your health care provider.  Use a warm mist humidifier or inhale steam from a shower to increase air moisture. This may make it easier to breathe.  Drink enough fluid to keep your urine clear or pale yellow.   Eat soups and other clear broths and maintain good nutrition.   Rest as needed.   Return to work when your temperature has returned to normal or as your health care provider advises. You may need to stay home longer to avoid infecting others. You can also use a face mask and careful hand washing to prevent spread of the virus.  Increase the usage of your inhaler if you have asthma.   Do not use any tobacco products, including cigarettes, chewing tobacco, or electronic cigarettes. If you need  help quitting, ask your health care provider. PREVENTION  The best way to protect yourself from getting a cold is to practice good hygiene.   Avoid oral or hand contact with people with cold symptoms.   Wash your hands often if contact occurs.  There is no clear evidence that vitamin C, vitamin E, echinacea, or exercise reduces the chance of developing a cold. However, it is always recommended to get plenty of rest, exercise, and practice good nutrition.  SEEK MEDICAL CARE IF:    You are getting worse rather than better.   Your symptoms are not controlled by medicine.   You have chills.  You have worsening shortness of breath.  You have brown or red mucus.  You have yellow or brown nasal discharge.  You have pain in your face, especially when you bend forward.  You have a fever.  You have swollen neck glands.  You have pain while swallowing.  You have white areas in the back of your throat. SEEK IMMEDIATE MEDICAL CARE IF:   You have severe or persistent:  Headache.  Ear pain.  Sinus pain.  Chest pain.  You have chronic lung disease and any of the following:  Wheezing.  Prolonged cough.  Coughing up blood.  A change in your usual mucus.  You have a stiff neck.  You have changes in your:  Vision.  Hearing.  Thinking.  Mood. MAKE SURE YOU:   Understand these instructions.  Will watch your condition.  Will get help right away if you are not doing well or get worse.   This information is not intended to replace advice given to you by your health care provider. Make sure you discuss any questions you have with your health care provider.   Document Released: 09/27/2000 Document Revised: 08/18/2014 Document Reviewed: 07/09/2013 Elsevier Interactive Patient Education [employer].

## 2015-07-07 NOTE — ED Notes (Signed)
Pt states she feels like someone is sitting on her chest, states she has asthma, non-smoker. Pt states all together today she has used her inhaler 5 times. Pt is in no distress at this time, laying in stretcher. Family at bedside.

## 2015-07-07 NOTE — ED Provider Notes (Signed)
[facility] Emergency Department Provider Note  ____________________________________________  Time seen: Approximately 2:01 AM  I have reviewed the triage vital signs and the nursing notes.   HISTORY  Chief Complaint Shortness of Breath    HPI Christina Perez is a 65 y.o. female who presents to the ED from home with a chief complaint of shortness of breath. Patient reports cold symptoms for several days; husband was hospitalized with flu last week. Complains of nonproductive cough, shortness of breath, wheezing associated with chest tightness. Denies associated fever, chills, abdominal pain, nausea, vomiting, diarrhea.Denies recent travel or trauma. Used her albuterol inhaler 5 times over the course of the day. Nothing makes her symptoms better or worse.   Past Medical History  Diagnosis Date  . Asthma   . Tremor     There are no active problems to display for this patient.   Past Surgical History  Procedure Laterality Date  . Abdominal hysterectomy    . Tonsillectomy    . Hernia repair      Current Outpatient Rx  Name  Route  Sig  Dispense  Refill  . albuterol (PROVENTIL HFA) 108 (90 Base) MCG/ACT inhaler   Inhalation   Inhale 2 puffs into the lungs every 6 (six) hours as needed.         Marland Kitchen aspirin 81 MG tablet   Oral   Take 81 mg by mouth daily.         . ondansetron (ZOFRAN ODT) 4 MG disintegrating tablet   Oral   Take 1 tablet (4 mg total) by mouth every 8 (eight) hours as needed for nausea or vomiting.   20 tablet   0   . propranolol (INDERAL) 80 MG tablet   Oral   Take 80 mg by mouth 3 (three) times daily.           Allergies Cephalosporins and Sulfa antibiotics  No family history on file.  Social History Social History  Substance Use Topics  . Smoking status: Never Smoker   . Smokeless tobacco: None  . Alcohol Use: No    Review of Systems  Constitutional: No fever/chills Eyes: No visual changes. ENT: No sore  throat. Cardiovascular: Positive for chest tightness. Respiratory: Positive for wheezing, nonproductive cough and shortness of breath. Gastrointestinal: No abdominal pain.  No nausea, no vomiting.  No diarrhea.  No constipation. Genitourinary: Negative for dysuria. Musculoskeletal: Negative for back pain. Skin: Negative for rash. Neurological: Negative for headaches, focal weakness or numbness.  10-point ROS otherwise negative.  ____________________________________________   PHYSICAL EXAM:  VITAL SIGNS: ED Triage Vitals  Enc Vitals Group     BP 07/06/15 2110 141/63 mmHg     Pulse Rate 07/06/15 2110 75     Resp 07/06/15 2110 20     Temp 07/06/15 2110 98.4 F (36.9 C)     Temp Source 07/06/15 2110 Oral     SpO2 07/06/15 2110 100 %     Weight 07/06/15 2110 182 lb (82.555 kg)     Height 07/06/15 2110 5\' 5"  (1.651 m)     Head Cir --      Peak Flow --      Pain Score 07/06/15 2111 7     Pain Loc --      Pain Edu? --      Excl. in [place]? --     Constitutional: Alert and oriented. Well appearing and in no acute distress. Eyes: Conjunctivae are normal. PERRL. EOMI. Head: Atraumatic. Nose:  No congestion/rhinnorhea. Mouth/Throat: Mucous membranes are moist.  Oropharynx non-erythematous. Neck: No stridor.   Cardiovascular: Normal rate, regular rhythm. Grossly normal heart sounds.  Good peripheral circulation. Respiratory: Normal respiratory effort.  No retractions. Lungs CTAB. No wheezing. Good aeration. Gastrointestinal: Soft and nontender. No distention. No abdominal bruits. No CVA tenderness. Musculoskeletal: No lower extremity tenderness nor edema.  No joint effusions. Neurologic:  Normal speech and language. No gross focal neurologic deficits are appreciated. No gait instability. Skin:  Skin is warm, dry and intact. No rash noted. Psychiatric: Mood and affect are normal. Speech and behavior are normal.  ____________________________________________   LABS (all labs ordered  are listed, but only abnormal results are displayed)  Labs Reviewed  BASIC METABOLIC PANEL - Abnormal; Notable for the following:    Creatinine, Ser 1.12 (*)    Calcium 11.0 (*)    GFR calc non Af Amer 51 (*)    GFR calc Af Amer 59 (*)    All other components within normal limits  TROPONIN I  CBC   ____________________________________________  EKG  ED ECG REPORT I, SUNG,JADE J, the attending physician, personally viewed and interpreted this ECG.   Date: 07/07/2015  EKG Time: 2115  Rate: 70  Rhythm: normal EKG, normal sinus rhythm  Axis: Normal  Intervals:none  ST&T Change: Nonspecific  ____________________________________________  RADIOLOGY  Chest 2 view (viewed by me, interpreted per Dr. Quintella Reichert): No acute cardiopulmonary disease. ____________________________________________   PROCEDURES  Procedure(s) performed: None  Critical Care performed: No  ____________________________________________   INITIAL IMPRESSION / ASSESSMENT AND PLAN / ED COURSE  Pertinent labs & imaging results that were available during my care of the patient were reviewed by me and considered in my medical decision making (see chart for details).  66 year old female with a history of asthma who presents with cold symptoms 1 week. Currently lungs are clear to auscultation; nonproductive cough noted. Chest x-ray is clear. Will initiate prednisone, tussionex and follow-up with her PCP. Strict return precautions given. Patient and family verbalize understanding and agree with plan of care. ____________________________________________   FINAL CLINICAL IMPRESSION(S) / ED DIAGNOSES  Final diagnoses:  Bronchitis      Paulette Blanch, MD 07/07/15 [phone]

## 2015-07-09 DIAGNOSIS — J209 Acute bronchitis, unspecified: Secondary | ICD-10-CM | POA: Diagnosis not present

## 2015-08-30 DIAGNOSIS — Z961 Presence of intraocular lens: Secondary | ICD-10-CM | POA: Diagnosis not present

## 2015-08-30 DIAGNOSIS — H35373 Puckering of macula, bilateral: Secondary | ICD-10-CM | POA: Diagnosis not present

## 2015-10-22 DIAGNOSIS — Z79899 Other long term (current) drug therapy: Secondary | ICD-10-CM | POA: Diagnosis not present

## 2015-10-22 DIAGNOSIS — E7800 Pure hypercholesterolemia, unspecified: Secondary | ICD-10-CM | POA: Diagnosis not present

## 2015-10-22 DIAGNOSIS — R739 Hyperglycemia, unspecified: Secondary | ICD-10-CM | POA: Diagnosis not present

## 2015-11-16 DIAGNOSIS — R739 Hyperglycemia, unspecified: Secondary | ICD-10-CM | POA: Diagnosis not present

## 2015-11-16 DIAGNOSIS — E7800 Pure hypercholesterolemia, unspecified: Secondary | ICD-10-CM | POA: Diagnosis not present

## 2015-11-16 DIAGNOSIS — Z0000 Encounter for general adult medical examination without abnormal findings: Secondary | ICD-10-CM | POA: Diagnosis not present

## 2015-11-16 DIAGNOSIS — Z1211 Encounter for screening for malignant neoplasm of colon: Secondary | ICD-10-CM | POA: Diagnosis not present

## 2015-11-16 DIAGNOSIS — Z79899 Other long term (current) drug therapy: Secondary | ICD-10-CM | POA: Diagnosis not present

## 2015-11-17 ENCOUNTER — Other Ambulatory Visit: Payer: Self-pay | Admitting: Family Medicine

## 2015-11-17 DIAGNOSIS — Z1231 Encounter for screening mammogram for malignant neoplasm of breast: Secondary | ICD-10-CM

## 2015-11-29 ENCOUNTER — Ambulatory Visit: Payer: PPO

## 2015-12-01 ENCOUNTER — Ambulatory Visit
Admission: RE | Admit: 2015-12-01 | Discharge: 2015-12-01 | Disposition: A | Discharge status: Home / Self Care | Payer: PPO | Source: Ambulatory Visit | Attending: Family Medicine | Admitting: Family Medicine

## 2015-12-01 ENCOUNTER — Other Ambulatory Visit: Payer: Self-pay | Admitting: Family Medicine

## 2015-12-01 DIAGNOSIS — Z1231 Encounter for screening mammogram for malignant neoplasm of breast: Secondary | ICD-10-CM

## 2015-12-02 DIAGNOSIS — Z86010 Personal history of colonic polyps: Secondary | ICD-10-CM | POA: Diagnosis not present

## 2016-01-13 DIAGNOSIS — Z23 Encounter for immunization: Secondary | ICD-10-CM | POA: Diagnosis not present

## 2016-02-08 ENCOUNTER — Encounter: Payer: Self-pay | Admitting: *Deleted

## 2016-02-09 ENCOUNTER — Ambulatory Visit: Payer: PPO | Admitting: Anesthesiology

## 2016-02-09 ENCOUNTER — Ambulatory Visit
Admission: RE | Admit: 2016-02-09 | Discharge: 2016-02-09 | Disposition: A | Discharge status: Home / Self Care | Payer: PPO | Source: Ambulatory Visit | Attending: Unknown Physician Specialty | Admitting: Unknown Physician Specialty

## 2016-02-09 ENCOUNTER — Encounter: Payer: Self-pay | Admitting: Unknown Physician Specialty

## 2016-02-09 ENCOUNTER — Encounter
Admission: RE | Disposition: A | Discharge status: Home / Self Care | Payer: Self-pay | Source: Ambulatory Visit | Attending: Unknown Physician Specialty

## 2016-02-09 DIAGNOSIS — K219 Gastro-esophageal reflux disease without esophagitis: Secondary | ICD-10-CM | POA: Insufficient documentation

## 2016-02-09 DIAGNOSIS — D123 Benign neoplasm of transverse colon: Secondary | ICD-10-CM | POA: Insufficient documentation

## 2016-02-09 DIAGNOSIS — Z1211 Encounter for screening for malignant neoplasm of colon: Secondary | ICD-10-CM | POA: Insufficient documentation

## 2016-02-09 DIAGNOSIS — Z86010 Personal history of colonic polyps: Secondary | ICD-10-CM | POA: Diagnosis not present

## 2016-02-09 DIAGNOSIS — K648 Other hemorrhoids: Secondary | ICD-10-CM | POA: Diagnosis not present

## 2016-02-09 DIAGNOSIS — I10 Essential (primary) hypertension: Secondary | ICD-10-CM | POA: Insufficient documentation

## 2016-02-09 DIAGNOSIS — J45909 Unspecified asthma, uncomplicated: Secondary | ICD-10-CM | POA: Insufficient documentation

## 2016-02-09 DIAGNOSIS — K5730 Diverticulosis of large intestine without perforation or abscess without bleeding: Secondary | ICD-10-CM | POA: Diagnosis not present

## 2016-02-09 DIAGNOSIS — Z7982 Long term (current) use of aspirin: Secondary | ICD-10-CM | POA: Insufficient documentation

## 2016-02-09 DIAGNOSIS — Z79899 Other long term (current) drug therapy: Secondary | ICD-10-CM | POA: Insufficient documentation

## 2016-02-09 DIAGNOSIS — K635 Polyp of colon: Secondary | ICD-10-CM | POA: Diagnosis not present

## 2016-02-09 DIAGNOSIS — E7800 Pure hypercholesterolemia, unspecified: Secondary | ICD-10-CM | POA: Diagnosis not present

## 2016-02-09 DIAGNOSIS — F329 Major depressive disorder, single episode, unspecified: Secondary | ICD-10-CM | POA: Insufficient documentation

## 2016-02-09 DIAGNOSIS — K640 First degree hemorrhoids: Secondary | ICD-10-CM | POA: Diagnosis not present

## 2016-02-09 DIAGNOSIS — K5790 Diverticulosis of intestine, part unspecified, without perforation or abscess without bleeding: Secondary | ICD-10-CM | POA: Diagnosis not present

## 2016-02-09 HISTORY — DX: Irritable bowel syndrome, unspecified: K58.9

## 2016-02-09 HISTORY — DX: Rosacea, unspecified: L71.9

## 2016-02-09 HISTORY — DX: Major depressive disorder, single episode, unspecified: F32.9

## 2016-02-09 HISTORY — DX: Pure hypercholesterolemia, unspecified: E78.00

## 2016-02-09 HISTORY — DX: Depression, unspecified: F32.A

## 2016-02-09 HISTORY — DX: Gastro-esophageal reflux disease without esophagitis: K21.9

## 2016-02-09 HISTORY — DX: Tremor, unspecified: R25.1

## 2016-02-09 HISTORY — DX: Pleural effusion, not elsewhere classified: J90

## 2016-02-09 HISTORY — DX: Personal history of other benign neoplasm: Z86.018

## 2016-02-09 HISTORY — PX: COLONOSCOPY WITH PROPOFOL: SHX5780

## 2016-02-09 HISTORY — DX: Essential (primary) hypertension: I10

## 2016-02-09 SURGERY — COLONOSCOPY WITH PROPOFOL
Anesthesia: General

## 2016-02-09 MED ORDER — LIDOCAINE HCL (PF) 1 % IJ SOLN
2.0000 mL | Freq: Once | INTRAMUSCULAR | Status: AC
  Administered 2016-02-09: 0.3 mL via INTRADERMAL

## 2016-02-09 MED ORDER — SODIUM CHLORIDE 0.9 % IV SOLN
INTRAVENOUS | Status: DC
  Administered 2016-02-09: 10:00:00 via INTRAVENOUS

## 2016-02-09 MED ORDER — SODIUM CHLORIDE 0.9 % IV SOLN: INTRAVENOUS | Status: DC

## 2016-02-09 MED ORDER — FENTANYL CITRATE (PF) 100 MCG/2ML IJ SOLN
INTRAMUSCULAR | Status: DC | PRN
  Administered 2016-02-09: 50 ug via INTRAVENOUS

## 2016-02-09 MED ORDER — LIDOCAINE 2% (20 MG/ML) 5 ML SYRINGE
INTRAMUSCULAR | Status: DC | PRN
  Administered 2016-02-09: 40 mg via INTRAVENOUS

## 2016-02-09 MED ORDER — MIDAZOLAM HCL 5 MG/5ML IJ SOLN
INTRAMUSCULAR | Status: DC | PRN
  Administered 2016-02-09: 1 mg via INTRAVENOUS

## 2016-02-09 MED ORDER — PHENYLEPHRINE HCL 10 MG/ML IJ SOLN
INTRAMUSCULAR | Status: DC | PRN
  Administered 2016-02-09: 100 ug via INTRAVENOUS

## 2016-02-09 MED ORDER — LIDOCAINE HCL (PF) 1 % IJ SOLN
INTRAMUSCULAR | Status: AC
  Administered 2016-02-09: 0.3 mL via INTRADERMAL
  Filled 2016-02-09: qty 2

## 2016-02-09 MED ORDER — PROPOFOL 10 MG/ML IV BOLUS
INTRAVENOUS | Status: DC | PRN
  Administered 2016-02-09: 50 mg via INTRAVENOUS

## 2016-02-09 MED ORDER — PROPOFOL 500 MG/50ML IV EMUL
INTRAVENOUS | Status: DC | PRN
  Administered 2016-02-09: 140 ug/kg/min via INTRAVENOUS

## 2016-02-09 NOTE — Op Note (Signed)
[facility] Patient Name: Christina Perez Procedure Date: 02/09/2016 10:01 AM MRN: [id number] Account #: [id number] Date of Birth: [birth year] Admit Type: Outpatient Age: 65 Room: [facility] ENDO ROOM 4 Gender: Female Note Status: Finalized Procedure:            Colonoscopy Indications:          High risk colon cancer surveillance: Personal history                        of colonic polyps Providers:            Manya Silvas, MD Referring MD:         Caprice Renshaw MD (Referring MD) Medicines:            Propofol per Anesthesia Complications:        No immediate complications. Procedure:            Pre-Anesthesia Assessment:                       - After reviewing the risks and benefits, the patient                        was deemed in satisfactory condition to undergo the                        procedure.                       After obtaining informed consent, the colonoscope was                        passed under direct vision. Throughout the procedure,                        the patient's blood pressure, pulse, and oxygen                        saturations were monitored continuously. The                        Colonoscope was introduced through the anus and                        advanced to the the cecum, identified by appendiceal                        orifice and ileocecal valve. The colonoscopy was                        performed without difficulty. The patient tolerated the                        procedure well. The quality of the bowel preparation                        was excellent. Findings:      A diminutive polyp was found in the proximal transverse colon. The polyp       was sessile. The polyp was removed with a jumbo cold forceps. Resection       and retrieval were complete.      A few small-mouthed  diverticula were found in the sigmoid colon.      Internal hemorrhoids were found during endoscopy. The hemorrhoids were       small  and Grade I (internal hemorrhoids that do not prolapse).      The exam was otherwise without abnormality. Impression:           - One diminutive polyp in the proximal transverse                        colon, removed with a jumbo cold forceps. Resected and                        retrieved.                       - Diverticulosis in the sigmoid colon.                       - Internal hemorrhoids.                       - The examination was otherwise normal. Recommendation:       - Await pathology results. Manya Silvas, MD 02/09/2016 10:31:00 AM This report has been signed electronically. Number of Addenda: 0 Note Initiated On: 02/09/2016 10:01 AM Scope Withdrawal Time: 0 hours 10 minutes 18 seconds  Total Procedure Duration: 0 hours 14 minutes 53 seconds       [facility]

## 2016-02-09 NOTE — H&P (Signed)
Primary Care Physician:  Marcello Fennel, MD Primary Gastroenterologist:  Dr. Vira Agar  Pre-Procedure History & Physical: HPI:  Christina Perez is a 65 y.o. female is here for an colonoscopy.   Past Medical History:  Diagnosis Date  . Asthma   . Depression   . GERD (gastroesophageal reflux disease)   . High cholesterol   . History of uterine leiomyoma   . Hypertension   . IBS (irritable bowel syndrome)   . Pleural effusion   . Rosacea   . Tremor   . Tremors of nervous system     Past Surgical History:  Procedure Laterality Date  . ABDOMINAL HYSTERECTOMY    . colon polyps    . GANGLION CYST EXCISION    . HERNIA REPAIR    . TONSILLECTOMY      Prior to Admission medications   Medication Sig Start Date End Date Taking? Authorizing Provider  escitalopram (LEXAPRO) 20 MG tablet Take 20 mg by mouth daily.   Yes Historical Provider, MD  fluticasone (FLONASE) 50 MCG/ACT nasal spray 2 sprays daily.   Yes Historical Provider, MD  hyoscyamine (LEVSIN SL) 0.125 MG SL tablet Place 0.125 mg under the tongue every 4 (four) hours as needed.   Yes Historical Provider, MD  omeprazole (PRILOSEC) 20 MG capsule Take 20 mg by mouth daily.   Yes Historical Provider, MD  ranitidine (ZANTAC) 150 MG capsule Take 150 mg by mouth every evening.   Yes Historical Provider, MD  simvastatin (ZOCOR) 20 MG tablet Take 20 mg by mouth daily.   Yes Historical Provider, MD  albuterol (PROVENTIL HFA) 108 (90 Base) MCG/ACT inhaler Inhale 2 puffs into the lungs every 6 (six) hours as needed.    Historical Provider, MD  aspirin 81 MG tablet Take 81 mg by mouth daily.    Historical Provider, MD  chlorpheniramine-HYDROcodone (TUSSIONEX PENNKINETIC ER) 10-8 MG/5ML SUER Take 5 mLs by mouth 2 (two) times daily. 07/07/15   Paulette Blanch, MD  ondansetron (ZOFRAN ODT) 4 MG disintegrating tablet Take 1 tablet (4 mg total) by mouth every 8 (eight) hours as needed for nausea or vomiting. 09/28/14   Loney Hering, MD   predniSONE (DELTASONE) 20 MG tablet 3 tablets daily x 5 days 07/07/15   Paulette Blanch, MD  propranolol (INDERAL) 80 MG tablet Take 80 mg by mouth 3 (three) times daily.    Historical Provider, MD    Allergies as of 01/25/2016 - Review Complete 07/06/2015  Allergen Reaction Noted  . Cephalosporins Rash 09/27/2014  . Sulfa antibiotics Rash 09/27/2014    No family history on file.  Social History   Social History  . Marital status: Married    Spouse name: N/A  . Number of children: N/A  . Years of education: N/A   Occupational History  . Not on file.   Social History Main Topics  . Smoking status: Never Smoker  . Smokeless tobacco: Not on file  . Alcohol use No  . Drug use: No  . Sexual activity: Not on file   Other Topics Concern  . Not on file   Social History Narrative  . No narrative on file    Review of Systems: See HPI, otherwise negative ROS  Physical Exam: BP 117/83   Pulse 86   Temp 97.3 F (36.3 C) (Tympanic)   Resp 17   Ht 5\' 5"  (1.651 m)   Wt 81.6 kg (180 lb)   SpO2 100%   BMI 29.95 kg/m  General:   Alert,  pleasant and cooperative in NAD Head:  Normocephalic and atraumatic. Neck:  Supple; no masses or thyromegaly. Lungs:  Clear throughout to auscultation.    Heart:  Regular rate and rhythm. Abdomen:  Soft, nontender and nondistended. Normal bowel sounds, without guarding, and without rebound.   Neurologic:  Alert and  oriented x4;  grossly normal neurologically.  Impression/Plan: Christina Perez is here for an colonoscopy to be performed for [facility] colon polyps  Risks, benefits, limitations, and alternatives regarding  colonoscopy have been reviewed with the patient.  Questions have been answered.  All parties agreeable.   Gaylyn Cheers, MD  02/09/2016, 10:06 AM

## 2016-02-09 NOTE — Transfer of Care (Signed)
Immediate Anesthesia Transfer of Care Note  Patient: Christina Perez  Procedure(s) Performed: Procedure(s): COLONOSCOPY WITH PROPOFOL (N/A)  Patient Location: PACU and Endoscopy Unit  Anesthesia Type:General  Level of Consciousness: awake, oriented and patient cooperative  Airway & Oxygen Therapy: Patient Spontanous Breathing and Patient connected to nasal cannula oxygen  Post-op Assessment: Report given to RN and Post -op Vital signs reviewed and stable  Post vital signs: Reviewed and stable  Last Vitals:  Vitals:   02/09/16 0931  BP: 117/83  Pulse: 86  Resp: 17  Temp: 36.3 C    Last Pain:  Vitals:   02/09/16 0931  TempSrc: Tympanic         Complications: No apparent anesthesia complications

## 2016-02-09 NOTE — Anesthesia Postprocedure Evaluation (Signed)
Anesthesia Post Note  Patient: Christina Perez  Procedure(s) Performed: Procedure(s) (LRB): COLONOSCOPY WITH PROPOFOL (N/A)  Patient location during evaluation: PACU Anesthesia Type: General Level of consciousness: awake Pain management: pain level controlled Vital Signs Assessment: post-procedure vital signs reviewed and stable Respiratory status: spontaneous breathing Cardiovascular status: stable Anesthetic complications: no    Last Vitals:  Vitals:   02/09/16 1030 02/09/16 1035  BP:  (!) 83/53  Pulse: 69 69  Resp: 17 18  Temp: 36.6 C 36.6 C    Last Pain:  Vitals:   02/09/16 1030  TempSrc: Tympanic                 VAN STAVEREN,Rumor Sun

## 2016-02-09 NOTE — Anesthesia Preprocedure Evaluation (Signed)
Anesthesia Evaluation  Patient identified by MRN, date of birth, ID band Patient awake    Reviewed: Allergy & Precautions, NPO status , Patient's Chart, lab work & pertinent test results  Airway Mallampati: II       Dental  (+) Teeth Intact   Pulmonary asthma ,    breath sounds clear to auscultation       Cardiovascular hypertension, Pt. on medications  Rhythm:Regular Rate:Normal     Neuro/Psych Depression    GI/Hepatic Neg liver ROS, GERD  Medicated,  Endo/Other  negative endocrine ROS  Renal/GU negative Renal ROS     Musculoskeletal   Abdominal Normal abdominal exam  (+)   Peds  Hematology negative hematology ROS (+)   Anesthesia Other Findings   Reproductive/Obstetrics                             Anesthesia Physical Anesthesia Plan  ASA: II  Anesthesia Plan: General   Post-op Pain Management:    Induction: Intravenous  Airway Management Planned: Natural Airway and Nasal Cannula  Additional Equipment:   Intra-op Plan:   Post-operative Plan:   Informed Consent: I have reviewed the patients History and Physical, chart, labs and discussed the procedure including the risks, benefits and alternatives for the proposed anesthesia with the patient or authorized representative who has indicated his/her understanding and acceptance.     Plan Discussed with: CRNA  Anesthesia Plan Comments:         Anesthesia Quick Evaluation

## 2016-02-10 LAB — SURGICAL PATHOLOGY

## 2016-02-29 DIAGNOSIS — H1031 Unspecified acute conjunctivitis, right eye: Secondary | ICD-10-CM | POA: Diagnosis not present

## 2016-05-11 DIAGNOSIS — E7800 Pure hypercholesterolemia, unspecified: Secondary | ICD-10-CM | POA: Diagnosis not present

## 2016-05-11 DIAGNOSIS — Z79899 Other long term (current) drug therapy: Secondary | ICD-10-CM | POA: Diagnosis not present

## 2016-05-11 DIAGNOSIS — R739 Hyperglycemia, unspecified: Secondary | ICD-10-CM | POA: Diagnosis not present

## 2016-05-18 DIAGNOSIS — R1013 Epigastric pain: Secondary | ICD-10-CM | POA: Diagnosis not present

## 2016-05-18 DIAGNOSIS — F419 Anxiety disorder, unspecified: Secondary | ICD-10-CM | POA: Diagnosis not present

## 2016-05-18 DIAGNOSIS — E7800 Pure hypercholesterolemia, unspecified: Secondary | ICD-10-CM | POA: Diagnosis not present

## 2016-05-18 DIAGNOSIS — R739 Hyperglycemia, unspecified: Secondary | ICD-10-CM | POA: Diagnosis not present

## 2016-05-18 DIAGNOSIS — Z79899 Other long term (current) drug therapy: Secondary | ICD-10-CM | POA: Diagnosis not present

## 2016-05-18 DIAGNOSIS — K219 Gastro-esophageal reflux disease without esophagitis: Secondary | ICD-10-CM | POA: Diagnosis not present

## 2016-05-18 DIAGNOSIS — J4540 Moderate persistent asthma, uncomplicated: Secondary | ICD-10-CM | POA: Diagnosis not present

## 2016-06-06 DIAGNOSIS — K219 Gastro-esophageal reflux disease without esophagitis: Secondary | ICD-10-CM | POA: Diagnosis not present

## 2016-06-09 IMAGING — CT CT ABD-PELV W/ CM
1 of 3 series · 14 of 32 positions shown, 19 images · IV contrast (omnipaque)
Comparison: None.

CLINICAL DATA: Epigastric pain since last night. Vomiting and
diarrhea.

EXAM:
CT ABDOMEN AND PELVIS WITH CONTRAST
TECHNIQUE: Multidetector CT imaging of the abdomen and pelvis was performed
using the standard protocol following bolus administration of
intravenous contrast.
CONTRAST:  100mL OMNIPAQUE IOHEXOL 300 MG/ML  SOLN

[Series 2: routine abd pel with · axial · 0.68mm/px · z∈[-333,+107]mm · 14 of 100 slices shown, 19 images]
[im 6/100  soft-tissue]
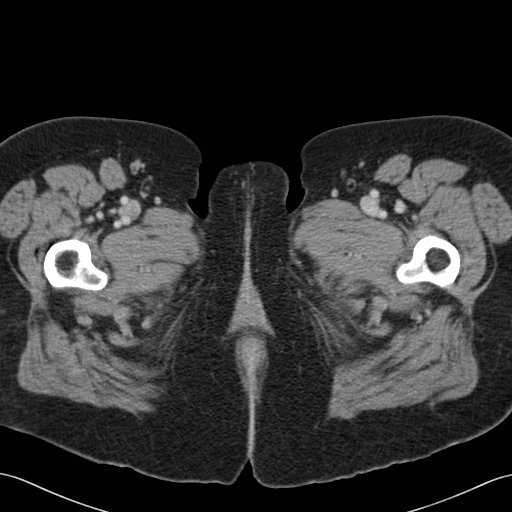
[im 6/100  bone]
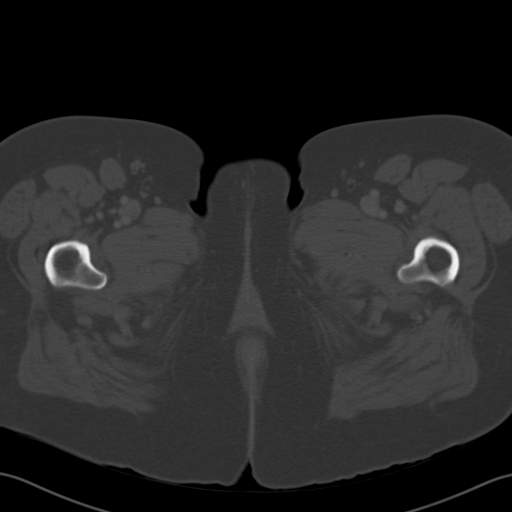
[im 16/100  soft-tissue]
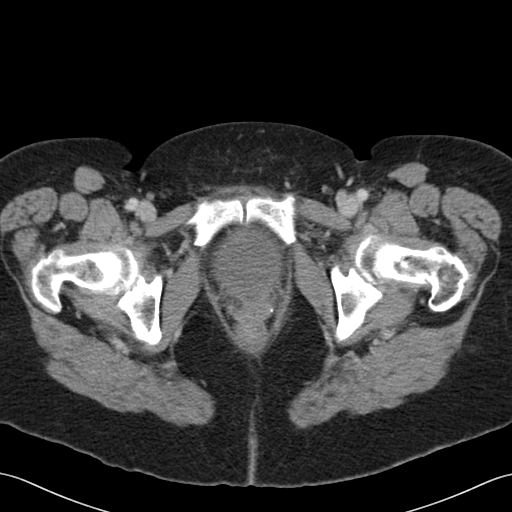
[im 21/100  soft-tissue]
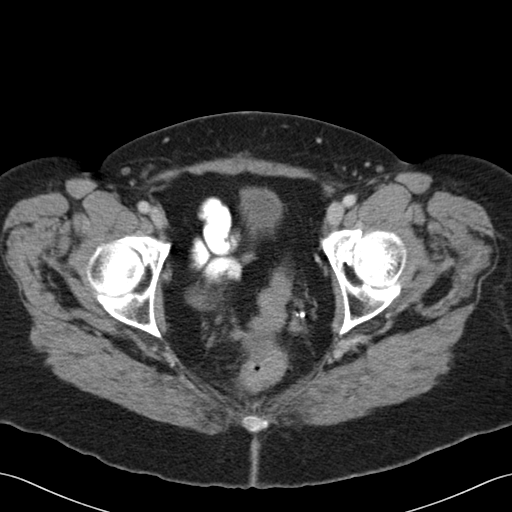
[im 27/100  soft-tissue]
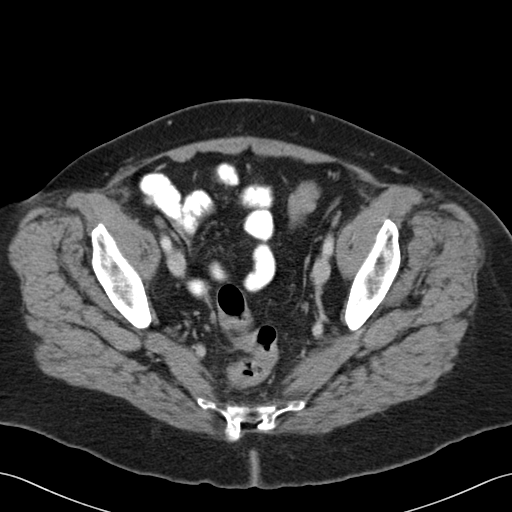
[im 37/100  soft-tissue]
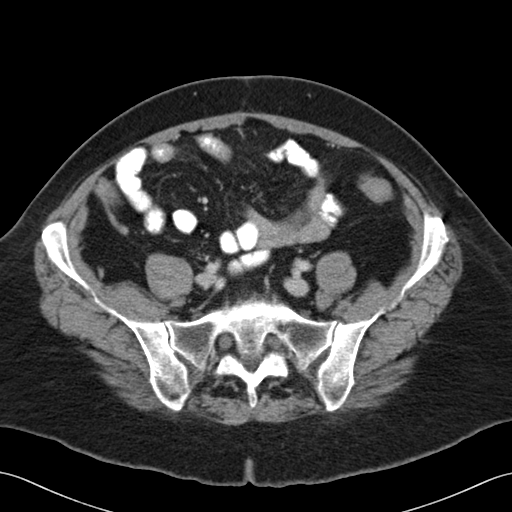
[im 42/100  soft-tissue]
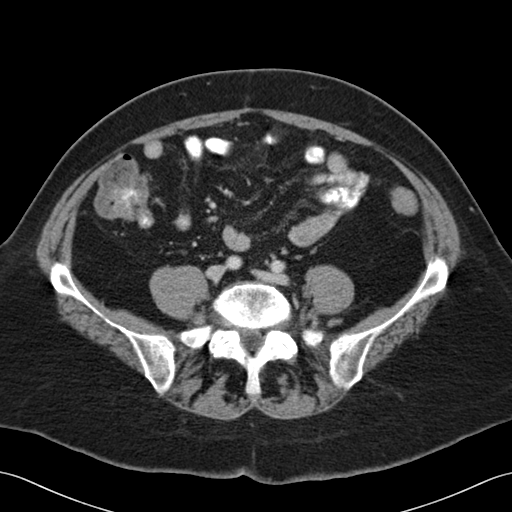
[im 53/100  soft-tissue]
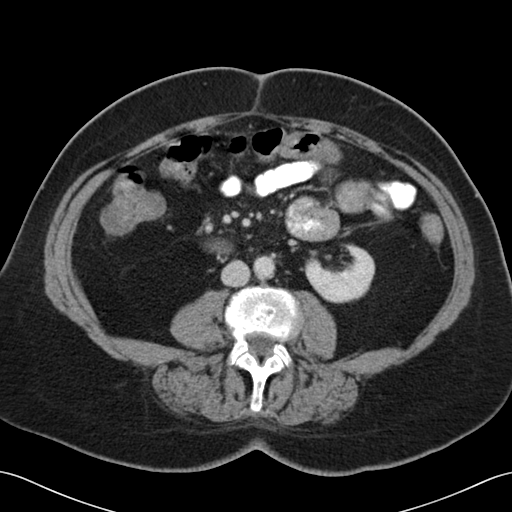
[im 58/100  soft-tissue]
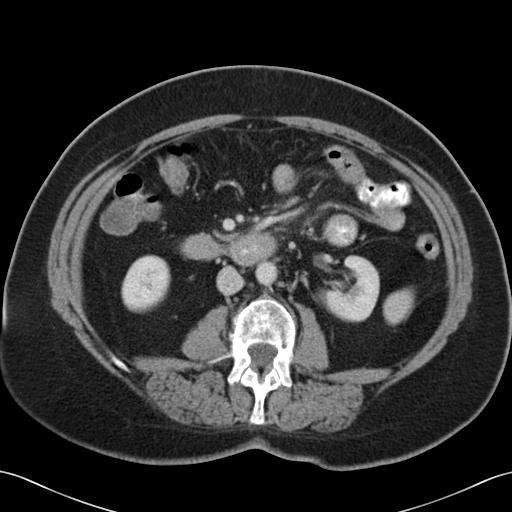
[im 63/100  soft-tissue]
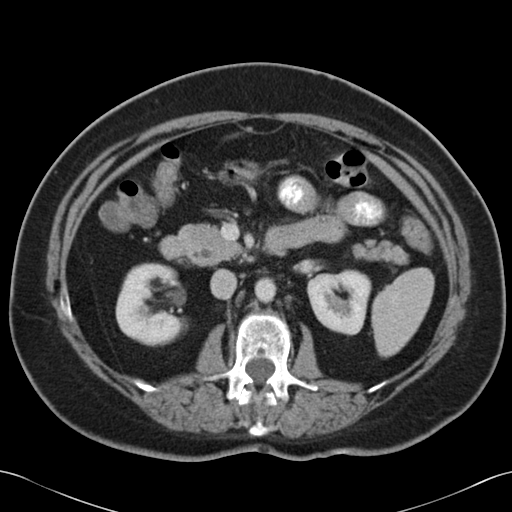
[im 63/100  bone]
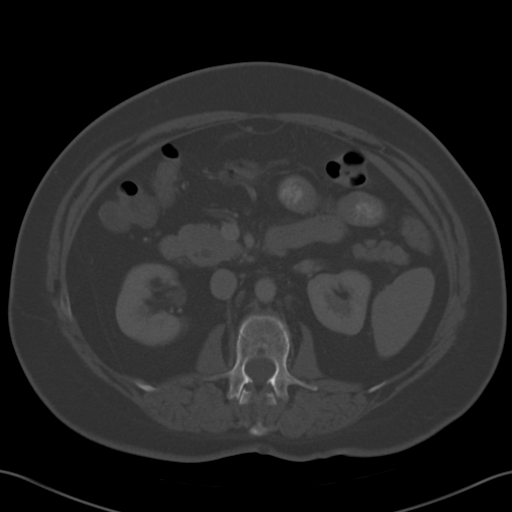
[im 73/100  soft-tissue]
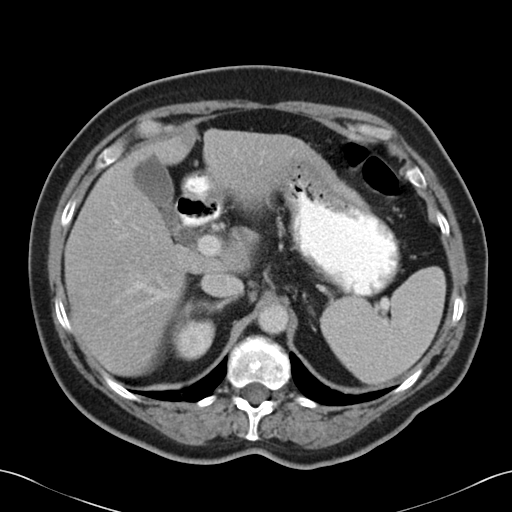
[im 79/100  soft-tissue]
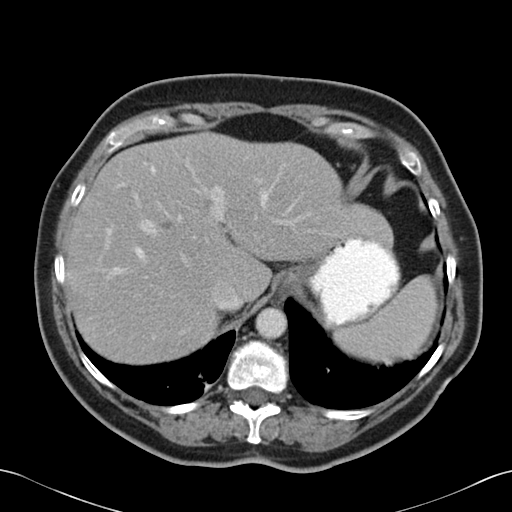
[im 79/100  lung]
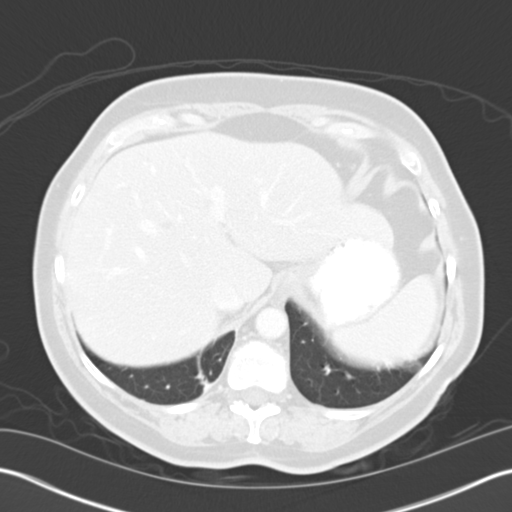
[im 84/100  soft-tissue]
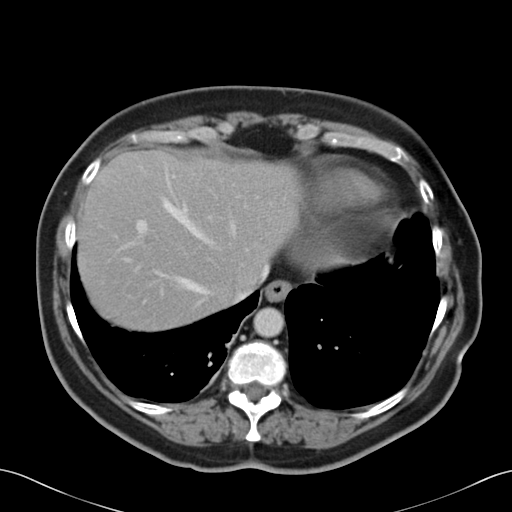
[im 84/100  lung]
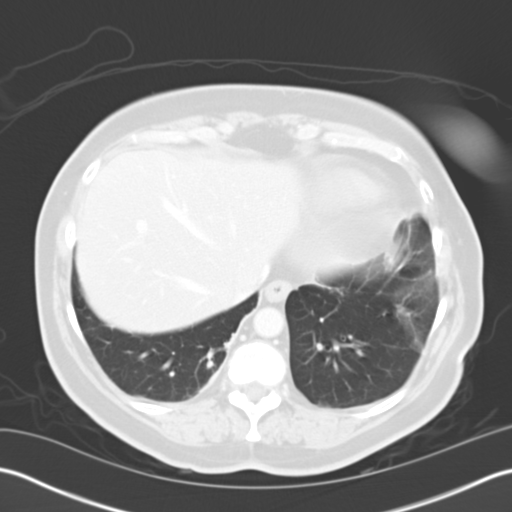
[im 89/100  lung]
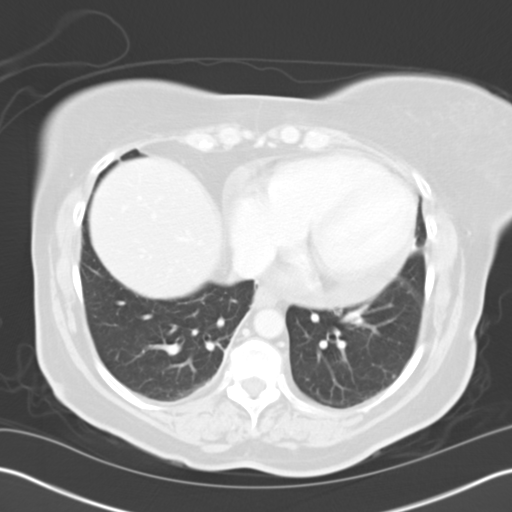
[im 94/100  soft-tissue]
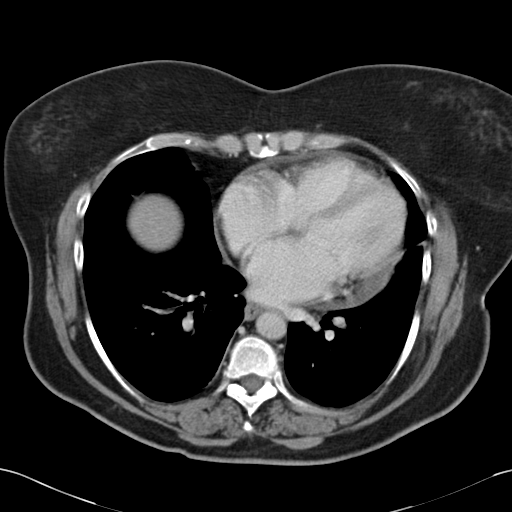
[im 94/100  lung]
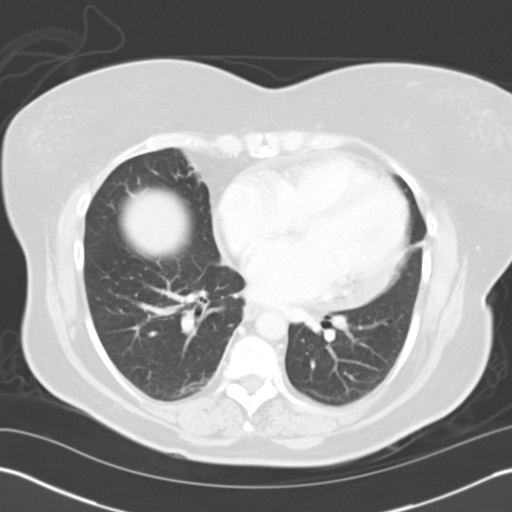

[14 of 32 positions shown; findings below may reference images not displayed]

FINDINGS: Linear scarring in the lung bases. 3 small nodules in the left lung
base, measuring less than 4 mm diameter. If the patient is at high
risk for bronchogenic carcinoma, follow-up chest CT at 1 year is
recommended. If the patient is at low risk, no follow-up is needed.
This recommendation follows the consensus statement: Guidelines for
Management of Small Pulmonary Nodules Detected on CT Scans: A
Statement from the [HOSPITAL] as published in Radiology
2559; [DATE].

Small pericardial effusion.

The liver, spleen, gallbladder, pancreas, adrenal glands, kidneys,
abdominal aorta, inferior vena cava, and retroperitoneal lymph nodes
are unremarkable. Stomach, small bowel, and colon are not abnormally
distended. Contrast material flows to the colon without evidence of
bowel obstruction. No free air or free fluid in the abdomen.
Abdominal wall musculature appears intact.

Pelvis: The appendix is normal. Uterus appears surgically absent.
Bladder is decompressed. No pelvic mass or lymphadenopathy. No free
or loculated pelvic fluid collections. Sigmoid colon is
unremarkable. Degenerative changes in the spine. Multiple Schmorl's
nodes. No destructive bone lesions.
IMPRESSION: Three small nodules in the left lung base, measuring less than 4 mm
diameter. See above recommendations. No acute process demonstrated
in the abdomen or pelvis. No evidence of bowel obstruction or
inflammation.

## 2016-06-15 DIAGNOSIS — R1013 Epigastric pain: Secondary | ICD-10-CM | POA: Diagnosis not present

## 2016-06-15 DIAGNOSIS — K2960 Other gastritis without bleeding: Secondary | ICD-10-CM | POA: Diagnosis not present

## 2016-06-15 DIAGNOSIS — K317 Polyp of stomach and duodenum: Secondary | ICD-10-CM | POA: Diagnosis not present

## 2016-06-15 DIAGNOSIS — K2950 Unspecified chronic gastritis without bleeding: Secondary | ICD-10-CM | POA: Diagnosis not present

## 2016-06-15 DIAGNOSIS — K3189 Other diseases of stomach and duodenum: Secondary | ICD-10-CM | POA: Diagnosis not present

## 2016-07-03 ENCOUNTER — Other Ambulatory Visit: Payer: Self-pay | Admitting: Nurse Practitioner

## 2016-07-03 DIAGNOSIS — K219 Gastro-esophageal reflux disease without esophagitis: Secondary | ICD-10-CM | POA: Diagnosis not present

## 2016-07-03 DIAGNOSIS — R1013 Epigastric pain: Secondary | ICD-10-CM | POA: Diagnosis not present

## 2016-07-03 DIAGNOSIS — R1032 Left lower quadrant pain: Secondary | ICD-10-CM | POA: Diagnosis not present

## 2016-07-03 DIAGNOSIS — R1031 Right lower quadrant pain: Secondary | ICD-10-CM | POA: Diagnosis not present

## 2016-07-03 DIAGNOSIS — R109 Unspecified abdominal pain: Secondary | ICD-10-CM

## 2016-07-03 DIAGNOSIS — K582 Mixed irritable bowel syndrome: Secondary | ICD-10-CM | POA: Diagnosis not present

## 2016-07-06 ENCOUNTER — Ambulatory Visit
Admission: RE | Admit: 2016-07-06 | Discharge: 2016-07-06 | Disposition: A | Discharge status: Home / Self Care | Payer: PPO | Source: Ambulatory Visit | Attending: Nurse Practitioner | Admitting: Nurse Practitioner

## 2016-07-06 DIAGNOSIS — R1031 Right lower quadrant pain: Secondary | ICD-10-CM | POA: Insufficient documentation

## 2016-07-06 DIAGNOSIS — I313 Pericardial effusion (noninflammatory): Secondary | ICD-10-CM | POA: Insufficient documentation

## 2016-07-06 DIAGNOSIS — R1013 Epigastric pain: Secondary | ICD-10-CM | POA: Insufficient documentation

## 2016-07-06 DIAGNOSIS — R109 Unspecified abdominal pain: Secondary | ICD-10-CM

## 2016-07-06 DIAGNOSIS — R1032 Left lower quadrant pain: Secondary | ICD-10-CM | POA: Insufficient documentation

## 2016-07-06 MED ORDER — IOPAMIDOL (ISOVUE-300) INJECTION 61%
100.0000 mL | Freq: Once | INTRAVENOUS | Status: AC | PRN
  Administered 2016-07-06: 100 mL via INTRAVENOUS

## 2016-08-22 DIAGNOSIS — J069 Acute upper respiratory infection, unspecified: Secondary | ICD-10-CM | POA: Diagnosis not present

## 2016-08-29 DIAGNOSIS — Z961 Presence of intraocular lens: Secondary | ICD-10-CM | POA: Diagnosis not present

## 2016-08-29 DIAGNOSIS — H35373 Puckering of macula, bilateral: Secondary | ICD-10-CM | POA: Diagnosis not present

## 2016-09-04 DIAGNOSIS — K582 Mixed irritable bowel syndrome: Secondary | ICD-10-CM | POA: Diagnosis not present

## 2016-09-04 DIAGNOSIS — K219 Gastro-esophageal reflux disease without esophagitis: Secondary | ICD-10-CM | POA: Diagnosis not present

## 2016-10-19 DIAGNOSIS — M25522 Pain in left elbow: Secondary | ICD-10-CM | POA: Diagnosis not present

## 2016-10-19 DIAGNOSIS — M25551 Pain in right hip: Secondary | ICD-10-CM | POA: Diagnosis not present

## 2016-11-07 DIAGNOSIS — E66811 Obesity, class 1: Secondary | ICD-10-CM | POA: Insufficient documentation

## 2016-11-07 DIAGNOSIS — M25522 Pain in left elbow: Secondary | ICD-10-CM | POA: Diagnosis not present

## 2016-11-07 DIAGNOSIS — E669 Obesity, unspecified: Secondary | ICD-10-CM | POA: Diagnosis not present

## 2016-11-07 DIAGNOSIS — M25551 Pain in right hip: Secondary | ICD-10-CM | POA: Diagnosis not present

## 2016-11-08 ENCOUNTER — Other Ambulatory Visit: Payer: Self-pay | Admitting: Unknown Physician Specialty

## 2016-11-08 DIAGNOSIS — M25551 Pain in right hip: Secondary | ICD-10-CM

## 2016-11-16 DIAGNOSIS — R739 Hyperglycemia, unspecified: Secondary | ICD-10-CM | POA: Diagnosis not present

## 2016-11-16 DIAGNOSIS — E7800 Pure hypercholesterolemia, unspecified: Secondary | ICD-10-CM | POA: Diagnosis not present

## 2016-11-16 DIAGNOSIS — Z79899 Other long term (current) drug therapy: Secondary | ICD-10-CM | POA: Diagnosis not present

## 2016-11-21 ENCOUNTER — Ambulatory Visit
Admission: RE | Admit: 2016-11-21 | Discharge: 2016-11-21 | Disposition: A | Discharge status: Home / Self Care | Payer: PPO | Source: Ambulatory Visit | Attending: Unknown Physician Specialty | Admitting: Unknown Physician Specialty

## 2016-11-21 DIAGNOSIS — M1611 Unilateral primary osteoarthritis, right hip: Secondary | ICD-10-CM | POA: Diagnosis not present

## 2016-11-21 DIAGNOSIS — M25551 Pain in right hip: Secondary | ICD-10-CM

## 2016-11-21 DIAGNOSIS — M160 Bilateral primary osteoarthritis of hip: Secondary | ICD-10-CM | POA: Insufficient documentation

## 2016-11-21 DIAGNOSIS — M25451 Effusion, right hip: Secondary | ICD-10-CM | POA: Insufficient documentation

## 2016-11-23 DIAGNOSIS — Z0000 Encounter for general adult medical examination without abnormal findings: Secondary | ICD-10-CM | POA: Diagnosis not present

## 2016-12-05 DIAGNOSIS — M25551 Pain in right hip: Secondary | ICD-10-CM | POA: Diagnosis not present

## 2016-12-05 DIAGNOSIS — E669 Obesity, unspecified: Secondary | ICD-10-CM | POA: Diagnosis not present

## 2016-12-05 DIAGNOSIS — M25522 Pain in left elbow: Secondary | ICD-10-CM | POA: Diagnosis not present

## 2017-01-18 ENCOUNTER — Other Ambulatory Visit: Payer: Self-pay | Admitting: Family Medicine

## 2017-01-18 DIAGNOSIS — Z1231 Encounter for screening mammogram for malignant neoplasm of breast: Secondary | ICD-10-CM

## 2017-02-01 DIAGNOSIS — Z23 Encounter for immunization: Secondary | ICD-10-CM | POA: Diagnosis not present

## 2017-02-12 ENCOUNTER — Ambulatory Visit
Admission: RE | Admit: 2017-02-12 | Discharge: 2017-02-12 | Disposition: A | Discharge status: Home / Self Care | Payer: PPO | Source: Ambulatory Visit | Attending: Family Medicine | Admitting: Family Medicine

## 2017-02-12 DIAGNOSIS — Z1231 Encounter for screening mammogram for malignant neoplasm of breast: Secondary | ICD-10-CM | POA: Insufficient documentation

## 2017-05-15 DIAGNOSIS — Z79899 Other long term (current) drug therapy: Secondary | ICD-10-CM | POA: Diagnosis not present

## 2017-05-15 DIAGNOSIS — E7800 Pure hypercholesterolemia, unspecified: Secondary | ICD-10-CM | POA: Diagnosis not present

## 2017-05-15 DIAGNOSIS — R739 Hyperglycemia, unspecified: Secondary | ICD-10-CM | POA: Diagnosis not present

## 2017-05-28 DIAGNOSIS — R739 Hyperglycemia, unspecified: Secondary | ICD-10-CM | POA: Diagnosis not present

## 2017-05-28 DIAGNOSIS — F419 Anxiety disorder, unspecified: Secondary | ICD-10-CM | POA: Diagnosis not present

## 2017-05-28 DIAGNOSIS — K219 Gastro-esophageal reflux disease without esophagitis: Secondary | ICD-10-CM | POA: Diagnosis not present

## 2017-05-28 DIAGNOSIS — R251 Tremor, unspecified: Secondary | ICD-10-CM | POA: Diagnosis not present

## 2017-05-28 DIAGNOSIS — Z79899 Other long term (current) drug therapy: Secondary | ICD-10-CM | POA: Diagnosis not present

## 2017-05-28 DIAGNOSIS — E7800 Pure hypercholesterolemia, unspecified: Secondary | ICD-10-CM | POA: Diagnosis not present

## 2017-08-28 DIAGNOSIS — Z961 Presence of intraocular lens: Secondary | ICD-10-CM | POA: Diagnosis not present

## 2017-08-28 DIAGNOSIS — H35373 Puckering of macula, bilateral: Secondary | ICD-10-CM | POA: Diagnosis not present

## 2017-11-20 DIAGNOSIS — E7800 Pure hypercholesterolemia, unspecified: Secondary | ICD-10-CM | POA: Diagnosis not present

## 2017-11-20 DIAGNOSIS — R739 Hyperglycemia, unspecified: Secondary | ICD-10-CM | POA: Diagnosis not present

## 2017-11-20 DIAGNOSIS — Z79899 Other long term (current) drug therapy: Secondary | ICD-10-CM | POA: Diagnosis not present

## 2017-11-27 DIAGNOSIS — Z79899 Other long term (current) drug therapy: Secondary | ICD-10-CM | POA: Diagnosis not present

## 2017-11-27 DIAGNOSIS — Z0000 Encounter for general adult medical examination without abnormal findings: Secondary | ICD-10-CM | POA: Diagnosis not present

## 2017-12-24 ENCOUNTER — Other Ambulatory Visit: Payer: Self-pay | Admitting: Pharmacist

## 2017-12-24 NOTE — Patient Outreach (Signed)
[facility]) Care Management  12/24/2017  Christina Perez Nov 22, 1950 [id number]   Incoming call from [employer] in response to the [facility] Medication Adherence Campaign. Speak with patient. HIPAA identifiers verified and verbal consent received.  [name] reports that she takes her simvastatin 20 mg once daily as directed. Reports that she had been taking it at bedtime, but missing doses. Reports that she recently saw her PCP and he instructed her to start taking the simvastatin with her morning medication instead. Denies any missed doses since taking it in the morning. Reports that she uses a weekly pillbox to organize her medications.  Denies any medication questions/concners at this time. Will close pharmacy episode.  Harlow Asa, PharmD, [facility] [phone]

## 2018-05-06 ENCOUNTER — Other Ambulatory Visit: Payer: Self-pay | Admitting: Pharmacist

## 2018-05-06 NOTE — Patient Outreach (Signed)
[facility]) Care Management  05/06/2018  Christina Perez [birth year] [id number]   Incoming call from [employer] in response to the [facility] Medication Adherence Campaign. Speak with patient. HIPAA identifiers verified and verbal consent received.  Note that I previously spoke to [name] in September regarding medication adherence to her simvastatin. Reports that she has continued to take her simvastatin once daily as directed using her weekly pillbox. Reports that she has gotten back to taking the medication each evening at bedtime, as she reports being able to remember to take it in the evenings. Counsel again on importance of this adherence. Note that patient last had the prescription filled for a 90 day supply on 02/02/18. Reports that she appropriately has only a couple of days of medication remaining and is getting ready to pick up a refill. Counsel patient to also consider using a daily phone alarm as a further adherence aid.  Patient denies any further medication questions/concerns at this time. Will close pharmacy episode.  Harlow Asa, PharmD, [facility] [phone]

## 2018-05-27 DIAGNOSIS — R739 Hyperglycemia, unspecified: Secondary | ICD-10-CM | POA: Diagnosis not present

## 2018-05-27 DIAGNOSIS — E7800 Pure hypercholesterolemia, unspecified: Secondary | ICD-10-CM | POA: Diagnosis not present

## 2018-05-27 DIAGNOSIS — Z79899 Other long term (current) drug therapy: Secondary | ICD-10-CM | POA: Diagnosis not present

## 2018-05-30 DIAGNOSIS — Z79899 Other long term (current) drug therapy: Secondary | ICD-10-CM | POA: Diagnosis not present

## 2018-05-30 DIAGNOSIS — K219 Gastro-esophageal reflux disease without esophagitis: Secondary | ICD-10-CM | POA: Diagnosis not present

## 2018-05-30 DIAGNOSIS — F3340 Major depressive disorder, recurrent, in remission, unspecified: Secondary | ICD-10-CM | POA: Diagnosis not present

## 2018-05-30 DIAGNOSIS — E7800 Pure hypercholesterolemia, unspecified: Secondary | ICD-10-CM | POA: Diagnosis not present

## 2018-05-30 DIAGNOSIS — R739 Hyperglycemia, unspecified: Secondary | ICD-10-CM | POA: Diagnosis not present

## 2018-05-30 DIAGNOSIS — J4540 Moderate persistent asthma, uncomplicated: Secondary | ICD-10-CM | POA: Diagnosis not present

## 2018-09-16 ENCOUNTER — Other Ambulatory Visit: Payer: Self-pay | Admitting: Family Medicine

## 2018-09-16 DIAGNOSIS — Z1231 Encounter for screening mammogram for malignant neoplasm of breast: Secondary | ICD-10-CM

## 2018-09-19 ENCOUNTER — Other Ambulatory Visit: Payer: Self-pay

## 2018-09-19 ENCOUNTER — Encounter (INDEPENDENT_AMBULATORY_CARE_PROVIDER_SITE_OTHER): Payer: Self-pay

## 2018-09-19 ENCOUNTER — Ambulatory Visit
Admission: RE | Admit: 2018-09-19 | Discharge: 2018-09-19 | Disposition: A | Discharge status: Home / Self Care | Payer: PPO | Source: Ambulatory Visit | Attending: Family Medicine | Admitting: Family Medicine

## 2018-09-19 DIAGNOSIS — Z1231 Encounter for screening mammogram for malignant neoplasm of breast: Secondary | ICD-10-CM | POA: Diagnosis not present

## 2018-10-21 DIAGNOSIS — Z961 Presence of intraocular lens: Secondary | ICD-10-CM | POA: Diagnosis not present

## 2018-10-21 DIAGNOSIS — H35373 Puckering of macula, bilateral: Secondary | ICD-10-CM | POA: Diagnosis not present

## 2018-11-26 DIAGNOSIS — R739 Hyperglycemia, unspecified: Secondary | ICD-10-CM | POA: Diagnosis not present

## 2018-11-26 DIAGNOSIS — E7800 Pure hypercholesterolemia, unspecified: Secondary | ICD-10-CM | POA: Diagnosis not present

## 2018-11-26 DIAGNOSIS — Z79899 Other long term (current) drug therapy: Secondary | ICD-10-CM | POA: Diagnosis not present

## 2018-12-03 DIAGNOSIS — Z1331 Encounter for screening for depression: Secondary | ICD-10-CM | POA: Diagnosis not present

## 2018-12-03 DIAGNOSIS — F3341 Major depressive disorder, recurrent, in partial remission: Secondary | ICD-10-CM | POA: Diagnosis not present

## 2018-12-03 DIAGNOSIS — N183 Chronic kidney disease, stage 3 unspecified: Secondary | ICD-10-CM | POA: Insufficient documentation

## 2018-12-03 DIAGNOSIS — Z0000 Encounter for general adult medical examination without abnormal findings: Secondary | ICD-10-CM | POA: Diagnosis not present

## 2018-12-03 DIAGNOSIS — J4520 Mild intermittent asthma, uncomplicated: Secondary | ICD-10-CM | POA: Diagnosis not present

## 2018-12-03 DIAGNOSIS — E7800 Pure hypercholesterolemia, unspecified: Secondary | ICD-10-CM | POA: Diagnosis not present

## 2018-12-03 DIAGNOSIS — Z79899 Other long term (current) drug therapy: Secondary | ICD-10-CM | POA: Diagnosis not present

## 2018-12-03 DIAGNOSIS — R739 Hyperglycemia, unspecified: Secondary | ICD-10-CM | POA: Diagnosis not present

## 2019-02-20 DIAGNOSIS — Z23 Encounter for immunization: Secondary | ICD-10-CM | POA: Diagnosis not present

## 2019-05-29 DIAGNOSIS — R739 Hyperglycemia, unspecified: Secondary | ICD-10-CM | POA: Diagnosis not present

## 2019-05-29 DIAGNOSIS — Z79899 Other long term (current) drug therapy: Secondary | ICD-10-CM | POA: Diagnosis not present

## 2019-05-29 DIAGNOSIS — N183 Chronic kidney disease, stage 3 unspecified: Secondary | ICD-10-CM | POA: Diagnosis not present

## 2019-05-29 DIAGNOSIS — E7800 Pure hypercholesterolemia, unspecified: Secondary | ICD-10-CM | POA: Diagnosis not present

## 2019-06-05 DIAGNOSIS — N1831 Chronic kidney disease, stage 3a: Secondary | ICD-10-CM | POA: Diagnosis not present

## 2019-08-28 ENCOUNTER — Other Ambulatory Visit: Payer: Self-pay | Admitting: Family Medicine

## 2019-08-28 DIAGNOSIS — Z1231 Encounter for screening mammogram for malignant neoplasm of breast: Secondary | ICD-10-CM

## 2019-09-23 ENCOUNTER — Ambulatory Visit
Admission: RE | Admit: 2019-09-23 | Discharge: 2019-09-23 | Disposition: A | Discharge status: Home / Self Care | Payer: PPO | Source: Ambulatory Visit | Attending: Family Medicine | Admitting: Family Medicine

## 2019-09-23 ENCOUNTER — Other Ambulatory Visit: Payer: Self-pay

## 2019-09-23 DIAGNOSIS — Z1231 Encounter for screening mammogram for malignant neoplasm of breast: Secondary | ICD-10-CM | POA: Diagnosis not present

## 2019-11-14 DIAGNOSIS — Z961 Presence of intraocular lens: Secondary | ICD-10-CM | POA: Diagnosis not present

## 2019-11-14 DIAGNOSIS — H35373 Puckering of macula, bilateral: Secondary | ICD-10-CM | POA: Diagnosis not present

## 2019-11-23 ENCOUNTER — Other Ambulatory Visit: Payer: Self-pay

## 2019-11-23 ENCOUNTER — Encounter: Payer: Self-pay | Admitting: Emergency Medicine

## 2019-11-23 ENCOUNTER — Ambulatory Visit (INDEPENDENT_AMBULATORY_CARE_PROVIDER_SITE_OTHER): Payer: PPO

## 2019-11-23 ENCOUNTER — Ambulatory Visit
Admission: EM | Admit: 2019-11-23 | Discharge: 2019-11-23 | Disposition: A | Discharge status: Home / Self Care | Payer: PPO | Attending: Family Medicine | Admitting: Family Medicine

## 2019-11-23 DIAGNOSIS — S42412A Displaced simple supracondylar fracture without intercondylar fracture of left humerus, initial encounter for closed fracture: Secondary | ICD-10-CM | POA: Diagnosis not present

## 2019-11-23 DIAGNOSIS — M7918 Myalgia, other site: Secondary | ICD-10-CM | POA: Diagnosis not present

## 2019-11-23 DIAGNOSIS — M542 Cervicalgia: Secondary | ICD-10-CM | POA: Diagnosis not present

## 2019-11-23 DIAGNOSIS — W19XXXA Unspecified fall, initial encounter: Secondary | ICD-10-CM

## 2019-11-23 DIAGNOSIS — M545 Low back pain: Secondary | ICD-10-CM | POA: Diagnosis not present

## 2019-11-23 DIAGNOSIS — M25422 Effusion, left elbow: Secondary | ICD-10-CM | POA: Diagnosis not present

## 2019-11-23 DIAGNOSIS — S99912A Unspecified injury of left ankle, initial encounter: Secondary | ICD-10-CM | POA: Diagnosis not present

## 2019-11-23 MED ORDER — NAPROXEN 500 MG PO TABS: 500.0000 mg | ORAL_TABLET | Freq: Two times a day (BID) | ORAL | 0 refills | Status: DC | PRN

## 2019-11-23 NOTE — Discharge Instructions (Addendum)
Rest, ice.  Medication as directed.  Take care  Dr. Shadaya Marschner  

## 2019-11-23 NOTE — ED Triage Notes (Signed)
Patient states that she fell yesterday in her yard and landed on her left side.  Patient states that she hit the left side of her face on the ground.  Patient c/o neck and back pain.  Patient also c/o left left knee and left ankle pain.  Patient denies LOC.

## 2019-11-23 NOTE — ED Provider Notes (Signed)
[facility]    CSN: [id number] Arrival date & time: 11/23/19  0951  History   Chief Complaint Chief Complaint  Patient presents with   Fall   Facial Injury   Ankle Pain    left   HPI  69 year old female presents with the above complaint.  Patient fell yesterday outside. Landed on her left side  She reports pain of the neck, low back, left knee, left ankle, left elbow. Pain 7/10 in severity. No loss of consciousness. No other associated symptoms. No other complaints.   Past Medical History:  Diagnosis Date   Asthma    Depression    GERD (gastroesophageal reflux disease)    High cholesterol    History of uterine leiomyoma    IBS (irritable bowel syndrome)    Pleural effusion    Rosacea    Tremor    Tremors of nervous system    Past Surgical History:  Procedure Laterality Date   ABDOMINAL HYSTERECTOMY     colon polyps     COLONOSCOPY WITH PROPOFOL N/A 02/09/2016   Procedure: COLONOSCOPY WITH PROPOFOL;  Surgeon: Manya Silvas, MD;  Location: [facility] ENDOSCOPY;  Service: Endoscopy;  Laterality: N/A;   GANGLION CYST EXCISION     HERNIA REPAIR     TONSILLECTOMY      OB History   No obstetric history on file.      Home Medications    Prior to Admission medications   Medication Sig Start Date End Date Taking? Authorizing Provider  aspirin 81 MG tablet Take 81 mg by mouth daily.   Yes [provider]  escitalopram (LEXAPRO) 20 MG tablet Take 20 mg by mouth daily.   Yes [provider]  hyoscyamine (LEVSIN SL) 0.125 MG SL tablet Place 0.125 mg under the tongue every 4 (four) hours as needed.   Yes [provider]  omeprazole (PRILOSEC) 20 MG capsule Take 20 mg by mouth daily.   Yes [provider]  propranolol (INDERAL) 80 MG tablet Take 80 mg by mouth 3 (three) times daily.   Yes [provider]  simvastatin (ZOCOR) 20 MG tablet Take 20 mg by mouth daily.   Yes [provider]    traZODone (DESYREL) 50 MG tablet Take 50 mg by mouth at bedtime. 10/02/19  Yes [provider]  albuterol (PROVENTIL HFA) 108 (90 Base) MCG/ACT inhaler Inhale 2 puffs into the lungs every 6 (six) hours as needed.    [provider]  Multiple Vitamin (MULTI-VITAMIN) tablet Take 1 tablet by mouth daily.    [provider]  naproxen (NAPROSYN) 500 MG tablet Take 1 tablet (500 mg total) by mouth 2 (two) times daily as needed for moderate pain. 11/23/19   Coral Spikes, DO  ondansetron (ZOFRAN ODT) 4 MG disintegrating tablet Take 1 tablet (4 mg total) by mouth every 8 (eight) hours as needed for nausea or vomiting. 09/28/14   Loney Hering, MD  fluticasone (FLONASE) 50 MCG/ACT nasal spray 2 sprays daily.  11/23/19  [provider]  ranitidine (ZANTAC) 150 MG capsule Take 150 mg by mouth every evening.  11/23/19  [provider]    Family History Family History  Problem Relation Age of Onset   Breast cancer Neg Hx     Social History Social History   Tobacco Use   Smoking status: Never Smoker   Smokeless tobacco: Never Used  Scientific laboratory technician Use: Never used  Substance Use Topics  Alcohol use: No   Drug use: No     Allergies   Cephalosporins and Sulfa antibiotics   Review of Systems Review of Systems  Musculoskeletal:       Pain - left ankle, left knee, left elbow, neck, low back.    Physical Exam Triage Vital Signs ED Triage Vitals  Enc Vitals Group     BP 11/23/19 1012 (!) 116/59     Pulse Rate 11/23/19 1012 (!) 54     Resp 11/23/19 1012 14     Temp 11/23/19 1012 98 F (36.7 C)     Temp Source 11/23/19 1012 Oral     SpO2 11/23/19 1012 99 %     Weight 11/23/19 1008 180 lb (81.6 kg)     Height 11/23/19 1008 5\' 5"  (1.651 m)     Head Circumference --      Peak Flow --      Pain Score 11/23/19 1008 7     Pain Loc --      Pain Edu? --      Excl. in [place]? --    Updated Vital Signs BP (!) 116/59 (BP Location: Right Arm)     Pulse (!) 54    Temp 98 F (36.7 C) (Oral)    Resp 14    Ht 5\' 5"  (1.651 m)    Wt 81.6 kg    SpO2 99%    BMI 29.95 kg/m   Visual Acuity Right Eye Distance:   Left Eye Distance:   Bilateral Distance:    Right Eye Near:   Left Eye Near:    Bilateral Near:     Physical Exam Vitals and nursing note reviewed.  Constitutional:      General: She is not in acute distress.    Appearance: Normal appearance. She is not ill-appearing.  HENT:     Head: Normocephalic and atraumatic.  Eyes:     General:        Right eye: No discharge.        Left eye: No discharge.     Conjunctiva/sclera: Conjunctivae normal.  Neck:     Comments: Mild tenderness of the posterior neck.  Cardiovascular:     Rate and Rhythm: Normal rate and regular rhythm.  Pulmonary:     Effort: Pulmonary effort is normal.     Breath sounds: Normal breath sounds.  Musculoskeletal:     Comments: Left elbow - tenderness on the medial aspected.  Left ankle - no significant swelling.   Neurological:     Mental Status: She is alert.  Psychiatric:        Mood and Affect: Mood normal.        Behavior: Behavior normal.    UC Treatments / Results  Labs (all labs ordered are listed, but only abnormal results are displayed) Labs Reviewed - No data to display  EKG   Radiology DG Cervical Spine Complete  Result Date: 11/23/2019 CLINICAL DATA:  [place] yesterday in the yard.  Neck pain. EXAM: CERVICAL SPINE - COMPLETE 4+ VIEW COMPARISON:  CT of the head on 02/16/2007 FINDINGS: There is congenital/developmental fusion of C3-4. There is 2 millimeters anterolisthesis of C2 on C3 and 4 millimeters retrolisthesis of C4 on C5. C4-C5 disc height loss and significant uncovertebral spurring. C5-C6 disc height loss and uncovertebral spurring. No acute fracture. Lung apices are clear. Prevertebral soft tissues are unremarkable. IMPRESSION: 1. No evidence for acute abnormality. 2. 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 Electronically Signed  By: Nolon Nations M.D.   On: 11/23/2019 11:42   DG Lumbar Spine Complete  Result Date: 11/23/2019 CLINICAL DATA:  Fall. Fell yesterday in the yard. Landed on her LEFT side. Neck pain. All over back pain. LEFT elbow pain with bruising. LATERAL LEFT ankle pain. EXAM: LUMBAR SPINE - COMPLETE 4+ VIEW COMPARISON:  None FINDINGS: There is normal alignment of the lumbar spine. Degenerative changes are identified with disc height loss and uncovertebral spurring primarily at L5-S1, L1-2, and L2-3. No lytic or blastic lesions. Bones appear radiolucent. Significant stool burden identified throughout mildly distended loops of colon. IMPRESSION: 1. Degenerative changes.  No evidence for acute  abnormality. 2. Significant stool burden. Electronically Signed   By: Nolon Nations M.D.   On: 11/23/2019 11:39   DG Elbow Complete Left  Result Date: 11/23/2019 CLINICAL DATA:  Fall EXAM: LEFT ELBOW - COMPLETE 3+ VIEW COMPARISON:  03/14/2013 FINDINGS: Plate, screw, and K-wire fixation of a supracondylar fracture of the left humerus. 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 No acute fracture or dislocation. Probable small elbow joint effusion, similar in appearance to prior examination. Soft tissues are unremarkable. IMPRESSION: 1. Plate, screw, and K-wire fixation of a supracondylar fracture of the left humerus. 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 No acute fracture or dislocation. 2. Probable small elbow joint effusion, similar in appearance to prior examination and of uncertain significance. Electronically Signed   By: Eddie Candle M.D.   On: 11/23/2019 11:40   DG Ankle Complete Left  Result Date: 11/23/2019 CLINICAL DATA:  Fall EXAM: LEFT ANKLE COMPLETE - 3+ VIEW COMPARISON:  None. FINDINGS: There is no evidence of fracture, dislocation, or joint effusion. There is no evidence of arthropathy or other focal bone abnormality. Soft tissues are  unremarkable. IMPRESSION: No fracture or dislocation of the left ankle. Electronically Signed   By: Eddie Candle M.D.   On: 11/23/2019 11:37    Procedures Procedures (including critical care time)  Medications Ordered in UC Medications - No data to display  Initial Impression / Assessment and Plan / UC Course  I have reviewed the triage vital signs and the nursing notes.  Pertinent labs & imaging results that were available during my care of the patient were reviewed by me and considered in my medical decision making (see chart for details).    69 year old female presents with MSK pain following a fall. Xrays obtained and were negative for fracture. Naproxen as needed. Supportive care.   Final Clinical Impressions(s) / UC Diagnoses   Final diagnoses:  Musculoskeletal pain  Fall, initial encounter     Discharge Instructions     Rest, ice.  Medication as directed.  Take care  Dr. Lacinda Axon    ED Prescriptions    Medication Sig Dispense Auth. Provider   naproxen (NAPROSYN) 500 MG tablet Take 1 tablet (500 mg total) by mouth 2 (two) times daily as needed for moderate pain. 30 tablet Coral Spikes, DO     PDMP not reviewed this encounter.   Coral Spikes, [place] 11/23/19 2313

## 2019-11-26 DIAGNOSIS — R739 Hyperglycemia, unspecified: Secondary | ICD-10-CM | POA: Diagnosis not present

## 2019-11-26 DIAGNOSIS — E7800 Pure hypercholesterolemia, unspecified: Secondary | ICD-10-CM | POA: Diagnosis not present

## 2019-11-26 DIAGNOSIS — N1831 Chronic kidney disease, stage 3a: Secondary | ICD-10-CM | POA: Diagnosis not present

## 2019-11-28 DIAGNOSIS — Z1331 Encounter for screening for depression: Secondary | ICD-10-CM | POA: Diagnosis not present

## 2019-11-28 DIAGNOSIS — Z0000 Encounter for general adult medical examination without abnormal findings: Secondary | ICD-10-CM | POA: Diagnosis not present

## 2020-01-19 DIAGNOSIS — M79631 Pain in right forearm: Secondary | ICD-10-CM | POA: Diagnosis not present

## 2020-01-19 DIAGNOSIS — M19031 Primary osteoarthritis, right wrist: Secondary | ICD-10-CM | POA: Diagnosis not present

## 2020-01-19 DIAGNOSIS — Z23 Encounter for immunization: Secondary | ICD-10-CM | POA: Diagnosis not present

## 2020-02-20 ENCOUNTER — Ambulatory Visit
Admission: EM | Admit: 2020-02-20 | Discharge: 2020-02-20 | Disposition: A | Discharge status: Home / Self Care | Payer: PPO | Attending: Emergency Medicine | Admitting: Emergency Medicine

## 2020-02-20 ENCOUNTER — Other Ambulatory Visit: Payer: Self-pay

## 2020-02-20 DIAGNOSIS — U071 COVID-19: Secondary | ICD-10-CM | POA: Diagnosis not present

## 2020-02-20 DIAGNOSIS — R059 Cough, unspecified: Secondary | ICD-10-CM

## 2020-02-20 DIAGNOSIS — Z882 Allergy status to sulfonamides status: Secondary | ICD-10-CM | POA: Diagnosis not present

## 2020-02-20 DIAGNOSIS — R051 Acute cough: Secondary | ICD-10-CM | POA: Diagnosis present

## 2020-02-20 DIAGNOSIS — J45909 Unspecified asthma, uncomplicated: Secondary | ICD-10-CM | POA: Diagnosis not present

## 2020-02-20 DIAGNOSIS — Z79899 Other long term (current) drug therapy: Secondary | ICD-10-CM | POA: Insufficient documentation

## 2020-02-20 DIAGNOSIS — Z7982 Long term (current) use of aspirin: Secondary | ICD-10-CM | POA: Diagnosis not present

## 2020-02-20 DIAGNOSIS — Z881 Allergy status to other antibiotic agents status: Secondary | ICD-10-CM | POA: Insufficient documentation

## 2020-02-20 NOTE — ED Triage Notes (Signed)
Pt is here with a cough that started 2 days ago, pt has taken Tylenol to relieve discomfort.

## 2020-02-20 NOTE — ED Provider Notes (Signed)
[facility]    CSN: [id number] Arrival date & time: 02/20/20  1519      History   Chief Complaint Chief Complaint  Patient presents with  . Cough    HPI Christina Perez is a 69 y.o. female.   69 year old female here for evaluation of cough.  Patient reports that she has had a cough for the past 2 days with a slight runny nose and a tightness in her chest.  Patient has a history of asthma.  She expresses that she has been wheezing some.  She has not been using her inhaler because her husband uses her inhaler.  She has had a low-grade fever as well but has not measured it.  Patient was exposed to her neighbor who just turned up Covid positive.  Patient denies shortness of breath or ear pain.     Past Medical History:  Diagnosis Date  . Asthma   . Depression   . GERD (gastroesophageal reflux disease)   . High cholesterol   . History of uterine leiomyoma   . IBS (irritable bowel syndrome)   . Pleural effusion   . Rosacea   . Tremor   . Tremors of nervous system     There are no problems to display for this patient.   Past Surgical History:  Procedure Laterality Date  . ABDOMINAL HYSTERECTOMY    . colon polyps    . COLONOSCOPY WITH PROPOFOL N/A 02/09/2016   Procedure: COLONOSCOPY WITH PROPOFOL;  Surgeon: Manya Silvas, MD;  Location: [facility] ENDOSCOPY;  Service: Endoscopy;  Laterality: N/A;  . GANGLION CYST EXCISION    . HERNIA REPAIR    . TONSILLECTOMY      OB History   No obstetric history on file.      Home Medications    Prior to Admission medications   Medication Sig Start Date End Date Taking? Authorizing Provider  albuterol (PROVENTIL HFA) 108 (90 Base) MCG/ACT inhaler Inhale 2 puffs into the lungs every 6 (six) hours as needed.    [provider]  aspirin 81 MG tablet Take 81 mg by mouth daily.    [provider]  escitalopram (LEXAPRO) 20 MG tablet Take 20 mg by mouth daily.    [provider]  hyoscyamine  (LEVSIN SL) 0.125 MG SL tablet Place 0.125 mg under the tongue every 4 (four) hours as needed.    [provider]  Multiple Vitamin (MULTI-VITAMIN) tablet Take 1 tablet by mouth daily.    [provider]  naproxen (NAPROSYN) 500 MG tablet Take 1 tablet (500 mg total) by mouth 2 (two) times daily as needed for moderate pain. 11/23/19   Coral Spikes, DO  omeprazole (PRILOSEC) 20 MG capsule Take 20 mg by mouth daily.    [provider]  ondansetron (ZOFRAN ODT) 4 MG disintegrating tablet Take 1 tablet (4 mg total) by mouth every 8 (eight) hours as needed for nausea or vomiting. 09/28/14   Loney Hering, MD  propranolol (INDERAL) 80 MG tablet Take 80 mg by mouth 3 (three) times daily.    [provider]  simvastatin (ZOCOR) 20 MG tablet Take 20 mg by mouth daily.    [provider]  traZODone (DESYREL) 50 MG tablet Take 50 mg by mouth at bedtime. 10/02/19   [provider]  fluticasone (FLONASE) 50 MCG/ACT nasal spray 2 sprays daily.  11/23/19  [provider]  ranitidine (ZANTAC) 150 MG capsule Take 150 mg by mouth every  evening.  11/23/19  [provider]    Family History Family History  Problem Relation Age of Onset  . Breast cancer Neg Hx     Social History Social History   Tobacco Use  . Smoking status: Never Smoker  . Smokeless tobacco: Never Used  Vaping Use  . Vaping Use: Never used  Substance Use Topics  . Alcohol use: No  . Drug use: No     Allergies   Cephalosporins and Sulfa antibiotics   Review of Systems Review of Systems  Constitutional: Positive for fever. Negative for activity change and appetite change.  HENT: Positive for rhinorrhea and sore throat. Negative for ear pain and sinus pressure.   Respiratory: Positive for cough, chest tightness and wheezing. Negative for shortness of breath.   Cardiovascular: Negative for chest pain.  Gastrointestinal: Negative for nausea and vomiting.    Musculoskeletal: Negative for back pain and myalgias.  Skin: Negative for rash.  Hematological: Negative.   Psychiatric/Behavioral: Negative.      Physical Exam Triage Vital Signs ED Triage Vitals  Enc Vitals Group     BP 02/20/20 1629 117/75     Pulse Rate 02/20/20 1629 66     Resp 02/20/20 1629 18     Temp 02/20/20 1629 99.8 F (37.7 C)     Temp Source 02/20/20 1629 Oral     SpO2 02/20/20 1629 98 %     Weight --      Height --      Head Circumference --      Peak Flow --      Pain Score 02/20/20 1626 0     Pain Loc --      Pain Edu? --      Excl. in [place]? --    No data found.  Updated Vital Signs BP 117/75 (BP Location: Right Arm)   Pulse 66   Temp 99.8 F (37.7 C) (Oral)   Resp 18   SpO2 98%   Visual Acuity Right Eye Distance:   Left Eye Distance:   Bilateral Distance:    Right Eye Near:   Left Eye Near:    Bilateral Near:     Physical Exam Vitals and nursing note reviewed.  Constitutional:      General: She is not in acute distress.    Appearance: Normal appearance. She is not toxic-appearing.  HENT:     Head: Normocephalic and atraumatic.     Right Ear: Tympanic membrane, ear canal and external ear normal.     Left Ear: Tympanic membrane, ear canal and external ear normal.     Nose: Congestion and rhinorrhea present.     Comments: Nasal passages have mild erythema but no edema.  Clear nasal discharge.    Mouth/Throat:     Mouth: Mucous membranes are moist.     Pharynx: Oropharynx is clear. No oropharyngeal exudate or posterior oropharyngeal erythema.     Comments: Posterior oropharynx has mild erythema and clear postnasal drip. Eyes:     General: No scleral icterus.    Extraocular Movements: Extraocular movements intact.     Conjunctiva/sclera: Conjunctivae normal.     Pupils: Pupils are equal, round, and reactive to light.  Cardiovascular:     Rate and Rhythm: Normal rate and regular rhythm.     Pulses: Normal pulses.     Heart sounds: Normal  heart sounds. No murmur heard.  No gallop.   Pulmonary:     Effort: Pulmonary effort is normal.  Breath sounds: Normal breath sounds. No wheezing, rhonchi or rales.  Musculoskeletal:        General: No swelling or tenderness. Normal range of motion.     Cervical back: Normal range of motion and neck supple.  Lymphadenopathy:     Cervical: No cervical adenopathy.  Skin:    General: Skin is warm.     Capillary Refill: Capillary refill takes less than 2 seconds.     Findings: No erythema or rash.  Neurological:     General: No focal deficit present.     Mental Status: She is alert and oriented to person, place, and time.  Psychiatric:        Mood and Affect: Mood normal.        Behavior: Behavior normal.        Thought Content: Thought content normal.        Judgment: Judgment normal.      UC Treatments / Results  Labs (all labs ordered are listed, but only abnormal results are displayed) Labs Reviewed  SARS CORONAVIRUS 2 (TAT 6-24 HRS)    EKG   Radiology No results found.  Procedures Procedures (including critical care time)  Medications Ordered in UC Medications - No data to display  Initial Impression / Assessment and Plan / UC Course  I have reviewed the triage vital signs and the nursing notes.  Pertinent labs & imaging results that were available during my care of the patient were reviewed by me and considered in my medical decision making (see chart for details).   Patient is here for evaluation of a cough, chest tightness, and wheezing x2 days.  She has a history of asthma but is not been using her rescue inhaler.  She reports that she has had a low-grade fever but has not measured it at home.  She was recently exposed to her neighbor who was diagnosed as being Covid positive last weekend.  Patient had Covid herself last December and has not been vaccinated against Covid.  She reports that she was outdoors with her neighbor but neither of them were wearing a  mask.  We will swab for Covid and discharge patient home with precautions.   Final Clinical Impressions(s) / UC Diagnoses   Final diagnoses:  Cough     Discharge Instructions     Isolate at home until the results of your Covid test are back.  If you are positive you will need to quarantine for 10 days from the start of your symptoms.  After the 10 days you can break quarantine if your symptoms have improved and you have not had a fever in 24 hours.  Use your inhaler as needed for chest tightness and wheezing.  Do not share one with your husband in case either of you are positive.  If you develop increasing shortness of breath, cannot catch her breath at rest, or cannot speak a full sentence you need to go to the ER for evaluation.  If you were Covid positive we will refer you to the infusion clinic for monoclonal antibody infusion.    ED Prescriptions    None     PDMP not reviewed this encounter.   Margarette Canada, NP 02/20/20 1711

## 2020-02-20 NOTE — Discharge Instructions (Addendum)
Isolate at home until the results of your Covid test are back.  If you are positive you will need to quarantine for 10 days from the start of your symptoms.  After the 10 days you can break quarantine if your symptoms have improved and you have not had a fever in 24 hours.  Use your inhaler as needed for chest tightness and wheezing.  Do not share one with your husband in case either of you are positive.  If you develop increasing shortness of breath, cannot catch her breath at rest, or cannot speak a full sentence you need to go to the ER for evaluation.  If you were Covid positive we will refer you to the infusion clinic for monoclonal antibody infusion.

## 2020-02-21 LAB — SARS CORONAVIRUS 2 (TAT 6-24 HRS): SARS Coronavirus 2: POSITIVE — AB

## 2020-02-22 ENCOUNTER — Telehealth: Payer: Self-pay | Admitting: Infectious Diseases

## 2020-02-22 NOTE — Telephone Encounter (Signed)
Called to Discuss with patient about Covid symptoms and the use of the monoclonal antibody infusion for those with mild to moderate Covid symptoms and at a high risk of hospitalization.     Pt appears to qualify for this infusion due to co-morbid conditions and/or a member of an at-risk group in accordance with the FDA Emergency Use Authorization.    She still feels about the same, but maybe more fatigued and no appetite today.  Symptoms started on 11/03  She had COVID before (December 2020) She will watch how she is doing over the next day or so and call us back should she want to be scheduled for treatment.   I gave her her husband's test results over the phone today also (negative). She is doing her best to stay away from him.    Christina Madeira, MSN, NP-C [facility] [email or web address] Pager: [phone] Office: [phone] [place]: [phone]

## 2020-02-24 DIAGNOSIS — U071 COVID-19: Secondary | ICD-10-CM | POA: Diagnosis not present

## 2020-02-24 DIAGNOSIS — B20 Human immunodeficiency virus [HIV] disease: Secondary | ICD-10-CM | POA: Diagnosis not present

## 2020-02-26 DIAGNOSIS — U071 COVID-19: Secondary | ICD-10-CM | POA: Diagnosis not present

## 2020-03-19 DIAGNOSIS — J4521 Mild intermittent asthma with (acute) exacerbation: Secondary | ICD-10-CM | POA: Diagnosis not present

## 2020-05-27 DIAGNOSIS — R739 Hyperglycemia, unspecified: Secondary | ICD-10-CM | POA: Diagnosis not present

## 2020-05-27 DIAGNOSIS — Z79899 Other long term (current) drug therapy: Secondary | ICD-10-CM | POA: Diagnosis not present

## 2020-05-27 DIAGNOSIS — N1831 Chronic kidney disease, stage 3a: Secondary | ICD-10-CM | POA: Diagnosis not present

## 2020-05-27 DIAGNOSIS — E7800 Pure hypercholesterolemia, unspecified: Secondary | ICD-10-CM | POA: Diagnosis not present

## 2020-06-15 DIAGNOSIS — L239 Allergic contact dermatitis, unspecified cause: Secondary | ICD-10-CM | POA: Diagnosis not present

## 2020-06-22 DIAGNOSIS — L239 Allergic contact dermatitis, unspecified cause: Secondary | ICD-10-CM | POA: Diagnosis not present

## 2020-08-03 DIAGNOSIS — R739 Hyperglycemia, unspecified: Secondary | ICD-10-CM | POA: Diagnosis not present

## 2020-08-03 DIAGNOSIS — F3341 Major depressive disorder, recurrent, in partial remission: Secondary | ICD-10-CM | POA: Diagnosis not present

## 2020-08-03 DIAGNOSIS — N1831 Chronic kidney disease, stage 3a: Secondary | ICD-10-CM | POA: Diagnosis not present

## 2020-08-03 DIAGNOSIS — E7800 Pure hypercholesterolemia, unspecified: Secondary | ICD-10-CM | POA: Diagnosis not present

## 2020-11-15 ENCOUNTER — Other Ambulatory Visit: Payer: Self-pay | Admitting: Family Medicine

## 2020-11-15 DIAGNOSIS — Z1231 Encounter for screening mammogram for malignant neoplasm of breast: Secondary | ICD-10-CM

## 2020-11-17 DIAGNOSIS — H35373 Puckering of macula, bilateral: Secondary | ICD-10-CM | POA: Diagnosis not present

## 2020-11-17 DIAGNOSIS — Z961 Presence of intraocular lens: Secondary | ICD-10-CM | POA: Diagnosis not present

## 2020-11-24 ENCOUNTER — Ambulatory Visit
Admission: RE | Admit: 2020-11-24 | Discharge: 2020-11-24 | Disposition: A | Discharge status: Home / Self Care | Payer: PPO | Source: Ambulatory Visit | Attending: Family Medicine | Admitting: Family Medicine

## 2020-11-24 ENCOUNTER — Other Ambulatory Visit: Payer: Self-pay

## 2020-11-24 DIAGNOSIS — Z1231 Encounter for screening mammogram for malignant neoplasm of breast: Secondary | ICD-10-CM | POA: Insufficient documentation

## 2020-11-25 ENCOUNTER — Encounter (INDEPENDENT_AMBULATORY_CARE_PROVIDER_SITE_OTHER): Payer: PPO | Admitting: Ophthalmology

## 2020-11-25 DIAGNOSIS — H43813 Vitreous degeneration, bilateral: Secondary | ICD-10-CM

## 2020-11-25 DIAGNOSIS — H35342 Macular cyst, hole, or pseudohole, left eye: Secondary | ICD-10-CM

## 2021-02-04 DIAGNOSIS — E7800 Pure hypercholesterolemia, unspecified: Secondary | ICD-10-CM | POA: Diagnosis not present

## 2021-02-04 DIAGNOSIS — R739 Hyperglycemia, unspecified: Secondary | ICD-10-CM | POA: Diagnosis not present

## 2021-02-04 DIAGNOSIS — N1831 Chronic kidney disease, stage 3a: Secondary | ICD-10-CM | POA: Diagnosis not present

## 2021-02-11 DIAGNOSIS — E7800 Pure hypercholesterolemia, unspecified: Secondary | ICD-10-CM | POA: Diagnosis not present

## 2021-02-11 DIAGNOSIS — Z0000 Encounter for general adult medical examination without abnormal findings: Secondary | ICD-10-CM | POA: Diagnosis not present

## 2021-02-11 DIAGNOSIS — R739 Hyperglycemia, unspecified: Secondary | ICD-10-CM | POA: Diagnosis not present

## 2021-02-11 DIAGNOSIS — N1831 Chronic kidney disease, stage 3a: Secondary | ICD-10-CM | POA: Diagnosis not present

## 2021-02-11 DIAGNOSIS — Z23 Encounter for immunization: Secondary | ICD-10-CM | POA: Diagnosis not present

## 2021-02-11 DIAGNOSIS — E669 Obesity, unspecified: Secondary | ICD-10-CM | POA: Diagnosis not present

## 2021-02-11 DIAGNOSIS — Z1211 Encounter for screening for malignant neoplasm of colon: Secondary | ICD-10-CM | POA: Diagnosis not present

## 2021-02-11 DIAGNOSIS — F3341 Major depressive disorder, recurrent, in partial remission: Secondary | ICD-10-CM | POA: Diagnosis not present

## 2021-02-11 DIAGNOSIS — Z79899 Other long term (current) drug therapy: Secondary | ICD-10-CM | POA: Diagnosis not present

## 2021-05-30 ENCOUNTER — Other Ambulatory Visit: Payer: Self-pay

## 2021-05-30 ENCOUNTER — Encounter (INDEPENDENT_AMBULATORY_CARE_PROVIDER_SITE_OTHER): Payer: PPO | Admitting: Ophthalmology

## 2021-05-30 DIAGNOSIS — H35371 Puckering of macula, right eye: Secondary | ICD-10-CM

## 2021-05-30 DIAGNOSIS — H43813 Vitreous degeneration, bilateral: Secondary | ICD-10-CM | POA: Diagnosis not present

## 2021-05-30 DIAGNOSIS — H35342 Macular cyst, hole, or pseudohole, left eye: Secondary | ICD-10-CM | POA: Diagnosis not present

## 2021-06-17 DIAGNOSIS — Z86010 Personal history of colonic polyps: Secondary | ICD-10-CM | POA: Diagnosis not present

## 2021-08-26 DIAGNOSIS — R739 Hyperglycemia, unspecified: Secondary | ICD-10-CM | POA: Diagnosis not present

## 2021-08-26 DIAGNOSIS — Z79899 Other long term (current) drug therapy: Secondary | ICD-10-CM | POA: Diagnosis not present

## 2021-08-26 DIAGNOSIS — E7800 Pure hypercholesterolemia, unspecified: Secondary | ICD-10-CM | POA: Diagnosis not present

## 2021-09-02 DIAGNOSIS — F3341 Major depressive disorder, recurrent, in partial remission: Secondary | ICD-10-CM | POA: Diagnosis not present

## 2021-09-02 DIAGNOSIS — E7800 Pure hypercholesterolemia, unspecified: Secondary | ICD-10-CM | POA: Diagnosis not present

## 2021-09-02 DIAGNOSIS — I10 Essential (primary) hypertension: Secondary | ICD-10-CM | POA: Diagnosis not present

## 2021-09-02 DIAGNOSIS — R739 Hyperglycemia, unspecified: Secondary | ICD-10-CM | POA: Diagnosis not present

## 2021-09-02 DIAGNOSIS — K219 Gastro-esophageal reflux disease without esophagitis: Secondary | ICD-10-CM | POA: Diagnosis not present

## 2021-09-02 DIAGNOSIS — N1831 Chronic kidney disease, stage 3a: Secondary | ICD-10-CM | POA: Diagnosis not present

## 2021-09-02 DIAGNOSIS — Z79899 Other long term (current) drug therapy: Secondary | ICD-10-CM | POA: Diagnosis not present

## 2021-09-04 ENCOUNTER — Other Ambulatory Visit: Payer: Self-pay

## 2021-09-04 ENCOUNTER — Encounter: Payer: Self-pay | Admitting: Emergency Medicine

## 2021-09-04 ENCOUNTER — Ambulatory Visit (INDEPENDENT_AMBULATORY_CARE_PROVIDER_SITE_OTHER): Payer: PPO

## 2021-09-04 ENCOUNTER — Ambulatory Visit
Admission: EM | Admit: 2021-09-04 | Discharge: 2021-09-04 | Disposition: A | Discharge status: Home / Self Care | Payer: PPO

## 2021-09-04 DIAGNOSIS — S40021A Contusion of right upper arm, initial encounter: Secondary | ICD-10-CM

## 2021-09-04 DIAGNOSIS — M25562 Pain in left knee: Secondary | ICD-10-CM

## 2021-09-04 DIAGNOSIS — S93502A Unspecified sprain of left great toe, initial encounter: Secondary | ICD-10-CM

## 2021-09-04 DIAGNOSIS — S161XXA Strain of muscle, fascia and tendon at neck level, initial encounter: Secondary | ICD-10-CM | POA: Diagnosis not present

## 2021-09-04 DIAGNOSIS — M545 Low back pain, unspecified: Secondary | ICD-10-CM

## 2021-09-04 DIAGNOSIS — M1712 Unilateral primary osteoarthritis, left knee: Secondary | ICD-10-CM | POA: Diagnosis not present

## 2021-09-04 DIAGNOSIS — W19XXXA Unspecified fall, initial encounter: Secondary | ICD-10-CM

## 2021-09-04 DIAGNOSIS — S8992XA Unspecified injury of left lower leg, initial encounter: Secondary | ICD-10-CM | POA: Diagnosis not present

## 2021-09-04 MED ORDER — PREDNISONE 10 MG (21) PO TBPK: ORAL_TABLET | ORAL | 0 refills | Status: DC

## 2021-09-04 NOTE — ED Triage Notes (Signed)
Patient states that she tripped over a shoe at home yesterday and fell and landed on the carpet.  Patient c/o pain in her left knee, left foot, lower back, neck and right arm.  Patient denies hitting her head.  Patient denies LOC.

## 2021-09-04 NOTE — ED Provider Notes (Signed)
[facility]    CSN: [id number] Arrival date & time: 09/04/21  8891      History   Chief Complaint Chief Complaint  Patient presents with   Fall   Back Pain   Knee Pain   Neck Pain    HPI Christina Perez is a 71 y.o. female.   HPI  71 year old female here for evaluation of orthopedic complaints.  Patient ports that she tripped in her home yesterday and landed on a carpeted surface sustaining an abrasion to her left knee.  She is now complaining of pain in her left knee, pain in her left big toe, pain in her low back, pain in her neck, and pain in her right upper arm.  She is able to walk and has full movement and range of motion.  She denies any bruising.  No numbness or tingling in any of her extremities.  She does have kidney disease so she does not take NSAIDs but she did take a baby aspirin last night.  She has been using relief factor, an herbal anti-inflammatory, without any significant improvements.  When asked about joint swelling she says that her knee may be a little swollen but she is unsure.  Past Medical History:  Diagnosis Date   Asthma    Depression    GERD (gastroesophageal reflux disease)    High cholesterol    History of uterine leiomyoma    IBS (irritable bowel syndrome)    Pleural effusion    Rosacea    Tremor    Tremors of nervous system     Patient Active Problem List   Diagnosis Date Noted   CKD (chronic kidney disease) stage 3, GFR 30-59 ml/min (HCC) 12/03/2018   Right hip pain 11/07/2016   Obesity (BMI 30.0-34.9) 11/07/2016   Left elbow pain 11/07/2016   GERD (gastroesophageal reflux disease) 06/06/2016   Asthma, well controlled 04/24/2014   Vitamin D deficiency 09/03/2013   Irritable bowel syndrome 09/03/2013   Hyperlipidemia 09/03/2013   Depressive disorder, not elsewhere classified 09/03/2013   Abnormal involuntary movements(781.0) 09/03/2013    Past Surgical History:  Procedure Laterality Date   ABDOMINAL HYSTERECTOMY      colon polyps     COLONOSCOPY WITH PROPOFOL N/A 02/09/2016   Procedure: COLONOSCOPY WITH PROPOFOL;  Surgeon: Manya Silvas, MD;  Location: [facility];  Service: Endoscopy;  Laterality: N/A;   GANGLION CYST EXCISION     HERNIA REPAIR     TONSILLECTOMY      OB History   No obstetric history on file.      Home Medications    Prior to Admission medications   Medication Sig Start Date End Date Taking? Authorizing Provider  albuterol (VENTOLIN HFA) 108 (90 Base) MCG/ACT inhaler Inhale into the lungs. 09/02/21 09/02/22 Yes [provider]  ascorbic acid (VITAMIN C) 1000 MG tablet Take by mouth.   Yes [provider]  aspirin EC 81 MG tablet Take by mouth.   Yes [provider]  Calcium Carbonate-Vitamin D 600-5 MG-MCG TABS Take by mouth.   Yes [provider]  escitalopram (LEXAPRO) 20 MG tablet Take 1 tablet by mouth daily. 02/14/21  Yes [provider]  fluticasone (FLONASE) 50 MCG/ACT nasal spray Place into the nose. 02/24/20  Yes [provider]  fluticasone (FLOVENT HFA) 110 MCG/ACT inhaler Inhale 1 puff into the lungs 2 (two) times daily. 07/25/17  Yes [provider]  hyoscyamine (LEVSIN) 0.125 MG tablet Take by mouth. 02/11/21  Yes [provider]  Magnesium 200 MG TABS Take by mouth.   Yes [provider]  Na Sulfate-K Sulfate-Mg Sulf 17.5-3.13-1.6 GM/177ML SOLN Take by mouth. 06/17/21  Yes [provider]  Omega-3 Fatty Acids (FISH OIL) 1000 MG CAPS Take by mouth.   Yes [provider]  omeprazole (PRILOSEC) 20 MG capsule Take 20 mg by mouth daily.   Yes [provider]  ondansetron (ZOFRAN-ODT) 4 MG disintegrating tablet Take by mouth. 09/28/14  Yes [provider]  predniSONE (STERAPRED UNI-PAK 21 TAB) 10 MG (21) TBPK tablet Take 6 tablets on day 1, 5 tablets day 2, 4 tablets day 3, 3 tablets day 4, 2 tablets day 5, 1 tablet day 6 09/04/21  Yes Margarette Canada, NP   propranolol (INNOPRAN XL) 80 MG 24 hr capsule Take 1 capsule by mouth daily. 01/30/20  Yes [provider]  simvastatin (ZOCOR) 20 MG tablet Take 1 tablet by mouth daily. 07/27/21  Yes [provider]  traZODone (DESYREL) 50 MG tablet Take 1 tablet by mouth at bedtime. 06/28/21  Yes [provider]  albuterol (PROVENTIL HFA) 108 (90 Base) MCG/ACT inhaler Inhale 2 puffs into the lungs every 6 (six) hours as needed.    [provider]  aspirin 81 MG tablet Take 81 mg by mouth daily.    [provider]  bifidobacterium infantis (ALIGN) capsule Take 1 capsule by mouth daily.    [provider]  Cholecalciferol 25 MCG (1000 UT) tablet Take by mouth.    [provider]  Cinnamon 500 MG capsule Take by mouth.    [provider]  Cranberry 400 MG CAPS Take by mouth.    [provider]  escitalopram (LEXAPRO) 20 MG tablet Take 20 mg by mouth daily.    [provider]  hyoscyamine (LEVSIN SL) 0.125 MG SL tablet Place 0.125 mg under the tongue every 4 (four) hours as needed.    [provider]  Multiple Vitamin (MULTI-VITAMIN) tablet Take 1 tablet by mouth daily.    [provider]  naproxen (NAPROSYN) 500 MG tablet Take 1 tablet (500 mg total) by mouth 2 (two) times daily as needed for moderate pain. 11/23/19   Coral Spikes, DO  omeprazole (PRILOSEC) 20 MG capsule Take by mouth.    [provider]  ondansetron (ZOFRAN ODT) 4 MG disintegrating tablet Take 1 tablet (4 mg total) by mouth every 8 (eight) hours as needed for nausea or vomiting. 09/28/14   Loney Hering, MD  propranolol (INDERAL) 80 MG tablet Take 80 mg by mouth 3 (three) times daily.    [provider]  psyllium (KONSYL) 33 % POWD Take by mouth.    [provider]  simvastatin (ZOCOR) 20 MG tablet Take 20 mg by mouth daily.    [provider]  traZODone (DESYREL) 50 MG tablet Take 50 mg by mouth at  bedtime. 10/02/19   [provider]  ranitidine (ZANTAC) 150 MG capsule Take 150 mg by mouth every evening.  11/23/19  [provider]    Family History Family History  Problem Relation Age of Onset   Breast cancer Neg Hx     Social History Social History   Tobacco Use   Smoking status: Never   Smokeless tobacco: Never  Vaping Use   Vaping Use: Never used  Substance Use Topics   Alcohol use: No   Drug use: No     Allergies   Cephalosporins and Sulfa  antibiotics   Review of Systems Review of Systems  Musculoskeletal:  Positive for arthralgias, joint swelling and myalgias.  Skin:  Positive for color change and wound.  Neurological:  Negative for weakness and numbness.  Hematological: Negative.   Psychiatric/Behavioral: Negative.      Physical Exam Triage Vital Signs ED Triage Vitals  Enc Vitals Group     BP 09/04/21 1003 125/85     Pulse Rate 09/04/21 1003 60     Resp 09/04/21 1003 14     Temp 09/04/21 1003 98.5 F (36.9 C)     Temp Source 09/04/21 1003 Oral     SpO2 09/04/21 1003 99 %     Weight 09/04/21 0959 164 lb (74.4 kg)     Height 09/04/21 0959 '5\' 5"'$  (1.651 m)     Head Circumference --      Peak Flow --      Pain Score 09/04/21 0959 5     Pain Loc --      Pain Edu? --      Excl. in [place]? --    No data found.  Updated Vital Signs BP 125/85 (BP Location: Right Arm)   Pulse 60   Temp 98.5 F (36.9 C) (Oral)   Resp 14   Ht '5\' 5"'$  (1.651 m)   Wt 164 lb (74.4 kg)   SpO2 99%   BMI 27.29 kg/m   Visual Acuity Right Eye Distance:   Left Eye Distance:   Bilateral Distance:    Right Eye Near:   Left Eye Near:    Bilateral Near:     Physical Exam Vitals and nursing note reviewed.  Constitutional:      Appearance: Normal appearance. She is not ill-appearing.  HENT:     Head: Normocephalic and atraumatic.  Musculoskeletal:        General: Tenderness and signs of injury present. No swelling or deformity. Normal range of motion.   Skin:    General: Skin is warm and dry.     Capillary Refill: Capillary refill takes less than 2 seconds.     Findings: Erythema present. No bruising or lesion.  Neurological:     General: No focal deficit present.     Mental Status: She is alert and oriented to person, place, and time.     Sensory: No sensory deficit.     Motor: No weakness.  Psychiatric:        Mood and Affect: Mood normal.        Behavior: Behavior normal.        Thought Content: Thought content normal.        Judgment: Judgment normal.     UC Treatments / Results  Labs (all labs ordered are listed, but only abnormal results are displayed) Labs Reviewed - No data to display  EKG   Radiology DG Knee Complete 4 Views Left  Result Date: 09/04/2021 CLINICAL DATA:  Trauma, fall, pain EXAM: LEFT KNEE - COMPLETE 4+ VIEW COMPARISON:  None. FINDINGS: No recent fracture or dislocation is seen. Small linear smooth marginated calcification seen adjacent to the lateral condyle of the distal femur may be residual from previous injury. Degenerative changes are noted with bony spurs, more so in the medial compartment. There is joint space narrowing in the medial compartment. There is no significant effusion. IMPRESSION: No recent fracture or dislocation is seen in the left knee. Degenerative changes are noted, more so in the medial compartment. Electronically Signed   By: Elmer Picker  M.D.   On: 09/04/2021 11:17    Procedures Procedures (including critical care time)  Medications Ordered in UC Medications - No data to display  Initial Impression / Assessment and Plan / UC Course  I have reviewed the triage vital signs and the nursing notes.  Pertinent labs & imaging results that were available during my care of the patient were reviewed by me and considered in my medical decision making (see chart for details).  Patient is a pleasant, nontoxic-appearing 71 year old female here for evaluation of multiple  orthopedic complaints after suffering a ground-level fall in her home on a carpeted surface yesterday.  She is complaining of pain in her neck, right upper arm, low back, left knee, and left big toe.  On exam patient is maintaining a normal axial carriage and all of her joints and extremities are normal anatomical position.  With examination of the neck there is no midline spinous tenderness but she does have some mild bilateral lower cervical paraspinous tenderness.  No overt spasm or muscle tension noted.  Low back has similar paraspinous spasm and no midline spinous tenderness.  Right upper arm patient is normal range of motion of her glenohumeral joint and there is no tenderness with palpation of the glenohumeral joint or the distal clavicle.  No tenderness with compression of the AC joint.  Patient does have some mild tenderness when palpating the tricep complex of the humerus.  Radial ulnar pulses in her right arm are 2+.  Left knee is also normal anatomical alignment.  There is no tenderness with palpation of the patella, popliteal fossa, tibial tuberosity, or medial or lateral joint line.  There are some mild tenderness with the proximal aspect of the medial tibia which the medial aspect of the patellar tendon overlies.  There is no erythema or bruising noted.  There is mild superficial abrasions to the left knee.  No scabbing or bleeding noted.  There is no lower extremity edema.  DP and PT pulses in the left foot are 2+.  Her foot and ankle are in normal anatomical alignment.  She is complaining of pain in her great toe that extends back into her medial edge of her foot.  There is no erythema, edema, or ecchymosis noted.  She does have some mild tenderness with palpation of the distal phalanx, proximal phalanx, IP joint, MCP joint.  She has a full range of motion and sensation without difficulty.  I do not suspect fracture.  I will obtain imaging of the left knee due to the tenderness of the proximal tibia  but I believe the rest of her injuries are all soft tissue in nature.  Left knee x-rays independently reviewed and evaluated by me.  Impression: Minutes of fracture or dislocation.  There is some bone spurring to the anterior lateral aspect of the tibial plateau which may be accounting for the patient's pain.  Radiology overread is pending. Radiology impression states that there is no recent fracture or dislocation seen.  There small linear smooth marginated calcification seen adjacent to the lateral condyle of the distal femur may be residual from previous injury.  Degenerative changes are noted with a bony spur more so to the medial compartment.  There is joint space narrowing in the medial compartment as well.  No significant effusion.  I will discharge patient home with a diagnosis of cervical strain, low back pain, and contusion of right upper arm, right knee, and sprain of the left toe.  Given her chronic kidney  disease she should not be taking NSAIDs so I will give her a steroid pack to help with inflammation.  She can also continue her over-the-counter relief factor.   Final Clinical Impressions(s) / UC Diagnoses   Final diagnoses:  Fall, initial encounter  Cervical strain, acute, initial encounter  Acute bilateral low back pain without sciatica  Acute pain of left knee  Contusion of right upper arm, initial encounter  Sprain of left great toe, initial encounter     Discharge Instructions      Your x-ray today did not show any evidence of fracture that you do have a bone spur where you are having pain.  Given that she fell on that knee this bone spur may have irritated the tissues and that is what is causing your pain.  I am going to give you a prescription for prednisone and when she did take it daily starting tomorrow morning with breakfast.  This will help you with inflammation and pain.  You can apply ice to your areas of pain for 20 minutes at a time 2-3 times a day as needed  for inflammation and pain.  You may also continue your over-the-counter relief factor as needed for pain.  I will give you some exercises for your neck and for your toe that can help with relieving the pain and returning to normal function.  If your symptoms continue, or they worsen, please return for reevaluation or see your primary care provider.     ED Prescriptions     Medication Sig Dispense Auth. Provider   predniSONE (STERAPRED UNI-PAK 21 TAB) 10 MG (21) TBPK tablet Take 6 tablets on day 1, 5 tablets day 2, 4 tablets day 3, 3 tablets day 4, 2 tablets day 5, 1 tablet day 6 21 tablet Margarette Canada, NP      PDMP not reviewed this encounter.   Margarette Canada, NP 09/04/21 1159

## 2021-09-04 NOTE — Discharge Instructions (Addendum)
Your x-ray today did not show any evidence of fracture that you do have a bone spur where you are having pain.  Given that she fell on that knee this bone spur may have irritated the tissues and that is what is causing your pain.  I am going to give you a prescription for prednisone and when she did take it daily starting tomorrow morning with breakfast.  This will help you with inflammation and pain.  You can apply ice to your areas of pain for 20 minutes at a time 2-3 times a day as needed for inflammation and pain.  You may also continue your over-the-counter relief factor as needed for pain.  I will give you some exercises for your neck and for your toe that can help with relieving the pain and returning to normal function.  If your symptoms continue, or they worsen, please return for reevaluation or see your primary care provider.

## 2021-09-09 ENCOUNTER — Encounter: Payer: Self-pay | Admitting: *Deleted

## 2021-09-13 ENCOUNTER — Ambulatory Visit: Payer: PPO | Admitting: Anesthesiology

## 2021-09-13 ENCOUNTER — Encounter
Admission: RE | Disposition: A | Discharge status: Home / Self Care | Payer: Self-pay | Source: Home / Self Care | Attending: Gastroenterology

## 2021-09-13 ENCOUNTER — Ambulatory Visit
Admission: RE | Admit: 2021-09-13 | Discharge: 2021-09-13 | Disposition: A | Discharge status: Home / Self Care | Payer: PPO | Attending: Gastroenterology | Admitting: Gastroenterology

## 2021-09-13 DIAGNOSIS — K635 Polyp of colon: Secondary | ICD-10-CM | POA: Insufficient documentation

## 2021-09-13 DIAGNOSIS — K589 Irritable bowel syndrome without diarrhea: Secondary | ICD-10-CM | POA: Diagnosis not present

## 2021-09-13 DIAGNOSIS — K219 Gastro-esophageal reflux disease without esophagitis: Secondary | ICD-10-CM | POA: Diagnosis not present

## 2021-09-13 DIAGNOSIS — Z86010 Personal history of colonic polyps: Secondary | ICD-10-CM | POA: Diagnosis not present

## 2021-09-13 DIAGNOSIS — Z1211 Encounter for screening for malignant neoplasm of colon: Secondary | ICD-10-CM | POA: Diagnosis not present

## 2021-09-13 DIAGNOSIS — K649 Unspecified hemorrhoids: Secondary | ICD-10-CM | POA: Diagnosis not present

## 2021-09-13 DIAGNOSIS — K5730 Diverticulosis of large intestine without perforation or abscess without bleeding: Secondary | ICD-10-CM | POA: Diagnosis not present

## 2021-09-13 DIAGNOSIS — E7800 Pure hypercholesterolemia, unspecified: Secondary | ICD-10-CM | POA: Insufficient documentation

## 2021-09-13 DIAGNOSIS — E785 Hyperlipidemia, unspecified: Secondary | ICD-10-CM | POA: Insufficient documentation

## 2021-09-13 DIAGNOSIS — K640 First degree hemorrhoids: Secondary | ICD-10-CM | POA: Insufficient documentation

## 2021-09-13 DIAGNOSIS — E559 Vitamin D deficiency, unspecified: Secondary | ICD-10-CM | POA: Insufficient documentation

## 2021-09-13 DIAGNOSIS — D123 Benign neoplasm of transverse colon: Secondary | ICD-10-CM | POA: Diagnosis not present

## 2021-09-13 DIAGNOSIS — N189 Chronic kidney disease, unspecified: Secondary | ICD-10-CM | POA: Diagnosis not present

## 2021-09-13 DIAGNOSIS — D126 Benign neoplasm of colon, unspecified: Secondary | ICD-10-CM | POA: Diagnosis not present

## 2021-09-13 DIAGNOSIS — J45909 Unspecified asthma, uncomplicated: Secondary | ICD-10-CM | POA: Insufficient documentation

## 2021-09-13 DIAGNOSIS — I129 Hypertensive chronic kidney disease with stage 1 through stage 4 chronic kidney disease, or unspecified chronic kidney disease: Secondary | ICD-10-CM | POA: Diagnosis not present

## 2021-09-13 DIAGNOSIS — F32A Depression, unspecified: Secondary | ICD-10-CM | POA: Diagnosis not present

## 2021-09-13 HISTORY — DX: Vitamin D deficiency, unspecified: E55.9

## 2021-09-13 HISTORY — DX: Chronic kidney disease, unspecified: N18.9

## 2021-09-13 HISTORY — DX: Hyperlipidemia, unspecified: E78.5

## 2021-09-13 HISTORY — PX: COLONOSCOPY WITH PROPOFOL: SHX5780

## 2021-09-13 SURGERY — COLONOSCOPY WITH PROPOFOL
Anesthesia: General

## 2021-09-13 MED ORDER — SODIUM CHLORIDE 0.9 % IV SOLN: INTRAVENOUS | Status: DC

## 2021-09-13 MED ORDER — PROPOFOL 10 MG/ML IV BOLUS
INTRAVENOUS | Status: DC | PRN
  Administered 2021-09-13: 100 mg via INTRAVENOUS
  Administered 2021-09-13 (×4): 30 mg via INTRAVENOUS

## 2021-09-13 NOTE — Anesthesia Preprocedure Evaluation (Signed)
Anesthesia Evaluation  Patient identified by MRN, date of birth, ID band Patient awake    Reviewed: Allergy & Precautions, NPO status , Patient's Chart, lab work & pertinent test results  History of Anesthesia Complications Negative for: history of anesthetic complications  Airway Mallampati: III  TM Distance: >3 FB Neck ROM: full    Dental  (+) Poor Dentition, Missing, Chipped   Pulmonary neg shortness of breath, asthma ,    Pulmonary exam normal        Cardiovascular Exercise Tolerance: Good hypertension, (-) angina(-) Past MI and (-) DOE Normal cardiovascular exam     Neuro/Psych PSYCHIATRIC DISORDERS negative neurological ROS     GI/Hepatic Neg liver ROS, GERD  Controlled,  Endo/Other  negative endocrine ROS  Renal/GU Renal disease  negative genitourinary   Musculoskeletal   Abdominal   Peds  Hematology negative hematology ROS (+)   Anesthesia Other Findings Past Medical History: No date: Asthma No date: Chronic kidney disease No date: Depression No date: GERD (gastroesophageal reflux disease) No date: High cholesterol No date: History of uterine leiomyoma No date: HLD (hyperlipidemia) No date: Hypertension No date: IBS (irritable bowel syndrome) No date: Pleural effusion No date: Rosacea No date: Tremor No date: Tremors of nervous system No date: Vitamin D deficiency  Past Surgical History: No date: ABDOMINAL HYSTERECTOMY No date: colon polyps 02/09/2016: COLONOSCOPY WITH PROPOFOL; N/A     Comment:  Procedure: COLONOSCOPY WITH PROPOFOL;  Surgeon: Manya Silvas, MD;  Location: [facility] END[facility];  Service:               Endoscopy;  Laterality: N/A; No date: GANGLION CYST EXCISION No date: HERNIA REPAIR No date: TONSILLECTOMY     Reproductive/Obstetrics negative OB ROS                             Anesthesia Physical Anesthesia Plan  ASA:  3  Anesthesia Plan: General   Post-op Pain Management:    Induction: Intravenous  PONV Risk Score and Plan: Propofol infusion and TIVA  Airway Management Planned: Natural Airway and Nasal Cannula  Additional Equipment:   Intra-op Plan:   Post-operative Plan:   Informed Consent: I have reviewed the patients History and Physical, chart, labs and discussed the procedure including the risks, benefits and alternatives for the proposed anesthesia with the patient or authorized representative who has indicated his/her understanding and acceptance.     Dental Advisory Given  Plan Discussed with: Anesthesiologist, CRNA and Surgeon  Anesthesia Plan Comments: (Patient consented for risks of anesthesia including but not limited to:  - adverse reactions to medications - risk of airway placement if required - damage to eyes, teeth, lips or other oral mucosa - nerve damage due to positioning  - sore throat or hoarseness - Damage to heart, brain, nerves, lungs, other parts of body or loss of life  Patient voiced understanding.)        Anesthesia Quick Evaluation

## 2021-09-13 NOTE — Interval H&P Note (Signed)
History and Physical Interval Note:  09/13/2021 12:39 PM  Christina Perez  has presented today for surgery, with the diagnosis of personal history adenomatous polyps.  The various methods of treatment have been discussed with the patient and family. After consideration of risks, benefits and other options for treatment, the patient has consented to  Procedure(s): COLONOSCOPY WITH PROPOFOL (N/A) as a surgical intervention.  The patient's history has been reviewed, patient examined, no change in status, stable for surgery.  I have reviewed the patient's chart and labs.  Questions were answered to the patient's satisfaction.     Lesly Rubenstein  Ok to proceed with colonoscopy

## 2021-09-13 NOTE — H&P (Signed)
Outpatient short stay form Pre-procedure 09/13/2021  Christina Rubenstein, MD  Primary Physician: Derinda Late, MD  Reason for visit:  Surveillance colonoscopy  History of present illness:    71 y/o lady with history of adenomatous polyps on last colonoscopy in 2017.  No blood thinners. No family history of GI malignancies. History of hysterectomy.    Current Facility-Administered Medications:    0.9 %  sodium chloride infusion, , Intravenous, Continuous, Taiz Bickle, Hilton Cork, MD  Medications Prior to Admission  Medication Sig Dispense Refill Last Dose   albuterol (VENTOLIN HFA) 108 (90 Base) MCG/ACT inhaler Inhale 2 puffs into the lungs every 6 (six) hours as needed.   Past Week   albuterol (VENTOLIN HFA) 108 (90 Base) MCG/ACT inhaler Inhale into the lungs.   Past Week   APPLE CIDER VINEGAR PO Take by mouth.   Past Week   ascorbic acid (VITAMIN C) 1000 MG tablet Take by mouth.   Past Week   aspirin 81 MG tablet Take 81 mg by mouth daily.   Past Week   aspirin EC 81 MG tablet Take by mouth.   Past Week   B Complex-C-Iron TABS Take by mouth.   Past Week   bifidobacterium infantis (ALIGN) capsule Take 1 capsule by mouth daily.   Past Week   Calcium Carbonate-Vitamin D 600-5 MG-MCG TABS Take by mouth.   Past Week   Cholecalciferol 25 MCG (1000 UT) tablet Take by mouth.   Past Week   Cinnamon 500 MG capsule Take by mouth.   Past Week   Cranberry 400 MG CAPS Take by mouth.   Past Week   escitalopram (LEXAPRO) 20 MG tablet Take 1 tablet by mouth daily.   Past Week   famotidine (PEPCID) 10 MG tablet Take 10 mg by mouth 2 (two) times daily.   Past Week   fluticasone (FLONASE) 50 MCG/ACT nasal spray Place into the nose.   Past Week   fluticasone (FLOVENT HFA) 110 MCG/ACT inhaler Inhale 1 puff into the lungs 2 (two) times daily.   Past Week   hyoscyamine (LEVSIN SL) 0.125 MG SL tablet Place 0.125 mg under the tongue every 4 (four) hours as needed.   Past Week   hyoscyamine (LEVSIN) 0.125 MG  tablet Take by mouth.   Past Week   Magnesium 200 MG TABS Take by mouth.   Past Week   Multiple Vitamin (MULTI-VITAMIN) tablet Take 1 tablet by mouth daily.   Past Week   Multiple Vitamins-Minerals ([employer] 50+) CAPS Take by mouth.   Past Week   Na Sulfate-K Sulfate-Mg Sulf 17.5-3.13-1.6 GM/177ML SOLN Take by mouth.   Past Week   naproxen (NAPROSYN) 500 MG tablet Take 1 tablet (500 mg total) by mouth 2 (two) times daily as needed for moderate pain. 30 tablet 0 Past Week   Omega-3 Fatty Acids (FISH OIL) 1000 MG CAPS Take by mouth.   Past Week   omeprazole (PRILOSEC) 20 MG capsule Take 20 mg by mouth daily.   09/12/2021   ondansetron (ZOFRAN ODT) 4 MG disintegrating tablet Take 1 tablet (4 mg total) by mouth every 8 (eight) hours as needed for nausea or vomiting. 20 tablet 0 Past Week   ondansetron (ZOFRAN-ODT) 4 MG disintegrating tablet Take by mouth.   Past Week   propranolol (INDERAL) 80 MG tablet Take 80 mg by mouth 3 (three) times daily.   09/12/2021   simvastatin (ZOCOR) 20 MG tablet Take 20 mg by mouth daily.   Past Week  simvastatin (ZOCOR) 20 MG tablet Take 1 tablet by mouth daily.   Past Week   traZODone (DESYREL) 50 MG tablet Take 50 mg by mouth at bedtime.   Past Week   traZODone (DESYREL) 50 MG tablet Take 1 tablet by mouth at bedtime.   Past Week   escitalopram (LEXAPRO) 20 MG tablet Take 20 mg by mouth daily.      omeprazole (PRILOSEC) 20 MG capsule Take by mouth.      predniSONE (STERAPRED UNI-PAK 21 TAB) 10 MG (21) TBPK tablet Take 6 tablets on day 1, 5 tablets day 2, 4 tablets day 3, 3 tablets day 4, 2 tablets day 5, 1 tablet day 6 (Patient not taking: Reported on 09/13/2021) 21 tablet 0 Completed Course   propranolol (INNOPRAN XL) 80 MG 24 hr capsule Take 1 capsule by mouth daily.      psyllium (KONSYL) 33 % POWD Take by mouth.        Allergies  Allergen Reactions   Cephalosporins Rash   Sulfa Antibiotics Rash and Swelling     Past Medical History:  Diagnosis Date    Asthma    Chronic kidney disease    Depression    GERD (gastroesophageal reflux disease)    High cholesterol    History of uterine leiomyoma    HLD (hyperlipidemia)    Hypertension    IBS (irritable bowel syndrome)    Pleural effusion    Rosacea    Tremor    Tremors of nervous system    Vitamin D deficiency     Review of systems:  Otherwise negative.    Physical Exam  Gen: Alert, oriented. Appears stated age.  HEENT: PERRLA. Lungs: No respiratory distress CV: RRR Abd: soft, benign, no masses Ext: No edema    Planned procedures: Proceed with colonoscopy. The patient understands the nature of the planned procedure, indications, risks, alternatives and potential complications including but not limited to bleeding, infection, perforation, damage to internal organs and possible oversedation/side effects from anesthesia. The patient agrees and gives consent to proceed.  Please refer to procedure notes for findings, recommendations and patient disposition/instructions.     Christina Rubenstein, MD [facility]

## 2021-09-13 NOTE — Op Note (Signed)
[facility] Patient Name: Christina Perez Procedure Date: 09/13/2021 12:00 PM MRN: [id number] Account #: [id number] Date of Birth: [birth year] Admit Type: Outpatient Age: 71 Room: [facility] ENDO ROOM 1 Gender: Female Note Status: Finalized Instrument Name: Jasper Riling [id number] Procedure:             Colonoscopy Indications:           Surveillance: Personal history of adenomatous polyps                         on last colonoscopy 5 years ago Providers:             Andrey Farmer MD, MD Referring MD:          Caprice Renshaw MD (Referring MD) Medicines:             Monitored Anesthesia Care Complications:         No immediate complications. Estimated blood loss:                         Minimal. Procedure:             Pre-Anesthesia Assessment:                        - Prior to the procedure, a History and Physical was                         performed, and patient medications and allergies were                         reviewed. The patient is competent. The risks and                         benefits of the procedure and the sedation options and                         risks were discussed with the patient. All questions                         were answered and informed consent was obtained.                         Patient identification and proposed procedure were                         verified by the physician, the nurse, the                         anesthesiologist, the anesthetist and the technician                         in the endoscopy suite. Mental Status Examination:                         alert and oriented. Airway Examination: normal                         oropharyngeal airway and neck mobility. Respiratory  Examination: clear to auscultation. CV Examination:                         normal. Prophylactic Antibiotics: The patient does not                         require prophylactic antibiotics. Prior                          Anticoagulants: The patient has taken no previous                         anticoagulant or antiplatelet agents. ASA Grade                         Assessment: II - A patient with mild systemic disease.                         After reviewing the risks and benefits, the patient                         was deemed in satisfactory condition to undergo the                         procedure. The anesthesia plan was to use monitored                         anesthesia care (MAC). Immediately prior to                         administration of medications, the patient was                         re-assessed for adequacy to receive sedatives. The                         heart rate, respiratory rate, oxygen saturations,                         blood pressure, adequacy of pulmonary ventilation, and                         response to care were monitored throughout the                         procedure. The physical status of the patient was                         re-assessed after the procedure.                        After obtaining informed consent, the colonoscope was                         passed under direct vision. Throughout the procedure,                         the patient's blood pressure, pulse, and oxygen  saturations were monitored continuously. The                         Colonoscope was introduced through the anus and                         advanced to the the cecum, identified by appendiceal                         orifice and ileocecal valve. The colonoscopy was                         performed without difficulty. The patient tolerated                         the procedure well. The quality of the bowel                         preparation was adequate to identify polyps. Findings:      The perianal and digital rectal examinations were normal.      A 1 mm polyp was found in the transverse colon. The polyp was sessile.       The polyp was removed with a cold  snare. Resection and retrieval were       complete. Estimated blood loss was minimal.      A few small-mouthed diverticula were found in the sigmoid colon.      Internal hemorrhoids were found during retroflexion. The hemorrhoids       were Grade I (internal hemorrhoids that do not prolapse).      The exam was otherwise without abnormality on direct and retroflexion       views. Impression:            - One 1 mm polyp in the transverse colon, removed with                         a cold snare. Resected and retrieved.                        - Diverticulosis in the sigmoid colon.                        - Internal hemorrhoids.                        - The examination was otherwise normal on direct and                         retroflexion views. Recommendation:        - Discharge patient to home.                        - Resume previous diet.                        - Continue present medications.                        - Await pathology results.                        -  Repeat colonoscopy for surveillance based on                         pathology results.                        - Return to referring physician as previously                         scheduled. Procedure Code(s):     --- Professional ---                        [phone], Colonoscopy, flexible; with removal of                         tumor(s), polyp(s), or other lesion(s) by snare                         technique Diagnosis Code(s):     --- Professional ---                        Z86.010, Personal history of colonic polyps                        K63.5, Polyp of colon                        K64.0, First degree hemorrhoids                        K57.30, Diverticulosis of large intestine without                         perforation or abscess without bleeding CPT copyright 2019 American [employer] Association. All rights reserved. The codes documented in this report are preliminary and upon coder review may  be revised to meet current  compliance requirements. Andrey Farmer MD, MD 09/13/2021 1:13:35 PM Number of Addenda: 0 Note Initiated On: 09/13/2021 12:00 PM Scope Withdrawal Time: 0 hours 9 minutes 21 seconds  Total Procedure Duration: 0 hours 13 minutes 34 seconds  Estimated Blood Loss:  Estimated blood loss was minimal.      [facility]

## 2021-09-13 NOTE — Transfer of Care (Signed)
Immediate Anesthesia Transfer of Care Note  Patient: Christina Perez  Procedure(s) Performed: COLONOSCOPY WITH PROPOFOL  Patient Location: Endoscopy Unit  Anesthesia Type:General  Level of Consciousness: drowsy  Airway & Oxygen Therapy: Patient Spontanous Breathing and Patient connected to nasal cannula oxygen  Post-op Assessment: Report given to RN, Post -op Vital signs reviewed and stable and Patient moving all extremities  Post vital signs: Reviewed and stable  Last Vitals:  Vitals Value Taken Time  BP 85/44 09/13/21 1312  Temp 36.1 C 09/13/21 1312  Pulse 62 09/13/21 1317  Resp 16 09/13/21 1317  SpO2 96 % 09/13/21 1317  Vitals shown include unvalidated device data.  Last Pain:  Vitals:   09/13/21 1312  TempSrc: Temporal  PainSc: Asleep      Patients Stated Pain Goal: 0 (96/29/52 8413)  Complications: No notable events documented.

## 2021-09-14 ENCOUNTER — Encounter: Payer: Self-pay | Admitting: Gastroenterology

## 2021-09-14 LAB — SURGICAL PATHOLOGY

## 2021-09-14 NOTE — Anesthesia Postprocedure Evaluation (Signed)
Anesthesia Post Note  Patient: Christina Perez  Procedure(s) Performed: COLONOSCOPY WITH PROPOFOL  Patient location during evaluation: PACU Anesthesia Type: General Level of consciousness: awake and alert Pain management: pain level controlled Vital Signs Assessment: post-procedure vital signs reviewed and stable Respiratory status: spontaneous breathing, nonlabored ventilation, respiratory function stable and patient connected to nasal cannula oxygen Cardiovascular status: blood pressure returned to baseline and stable Postop Assessment: no apparent nausea or vomiting Anesthetic complications: no   No notable events documented.   Last Vitals:  Vitals:   09/13/21 1320 09/13/21 1330  BP: 114/70 123/80  Pulse: 65 (!) 59  Resp: 18 14  Temp:    SpO2: 100% 100%    Last Pain:  Vitals:   09/14/21 0743  TempSrc:   PainSc: 0-No pain                 Molli Barrows

## 2021-11-28 ENCOUNTER — Encounter (INDEPENDENT_AMBULATORY_CARE_PROVIDER_SITE_OTHER): Payer: PPO | Admitting: Ophthalmology

## 2021-11-28 DIAGNOSIS — H43813 Vitreous degeneration, bilateral: Secondary | ICD-10-CM

## 2021-11-28 DIAGNOSIS — H35371 Puckering of macula, right eye: Secondary | ICD-10-CM

## 2021-11-28 DIAGNOSIS — H35342 Macular cyst, hole, or pseudohole, left eye: Secondary | ICD-10-CM

## 2022-02-08 ENCOUNTER — Other Ambulatory Visit: Payer: Self-pay | Admitting: Family Medicine

## 2022-02-08 DIAGNOSIS — F325 Major depressive disorder, single episode, in full remission: Secondary | ICD-10-CM | POA: Diagnosis not present

## 2022-02-08 DIAGNOSIS — J45909 Unspecified asthma, uncomplicated: Secondary | ICD-10-CM | POA: Diagnosis not present

## 2022-02-08 DIAGNOSIS — E785 Hyperlipidemia, unspecified: Secondary | ICD-10-CM | POA: Diagnosis not present

## 2022-02-08 DIAGNOSIS — G250 Essential tremor: Secondary | ICD-10-CM | POA: Diagnosis not present

## 2022-02-08 DIAGNOSIS — G4700 Insomnia, unspecified: Secondary | ICD-10-CM | POA: Diagnosis not present

## 2022-02-08 DIAGNOSIS — K589 Irritable bowel syndrome without diarrhea: Secondary | ICD-10-CM | POA: Diagnosis not present

## 2022-02-08 DIAGNOSIS — G8929 Other chronic pain: Secondary | ICD-10-CM | POA: Diagnosis not present

## 2022-02-08 DIAGNOSIS — N1831 Chronic kidney disease, stage 3a: Secondary | ICD-10-CM | POA: Diagnosis not present

## 2022-02-08 DIAGNOSIS — K219 Gastro-esophageal reflux disease without esophagitis: Secondary | ICD-10-CM | POA: Diagnosis not present

## 2022-02-08 DIAGNOSIS — E663 Overweight: Secondary | ICD-10-CM | POA: Diagnosis not present

## 2022-02-08 DIAGNOSIS — H442B9 Degenerative myopia with macular hole, unspecified eye: Secondary | ICD-10-CM | POA: Diagnosis not present

## 2022-02-08 DIAGNOSIS — Z1231 Encounter for screening mammogram for malignant neoplasm of breast: Secondary | ICD-10-CM

## 2022-02-08 DIAGNOSIS — K649 Unspecified hemorrhoids: Secondary | ICD-10-CM | POA: Diagnosis not present

## 2022-02-21 ENCOUNTER — Ambulatory Visit
Admission: RE | Admit: 2022-02-21 | Discharge: 2022-02-21 | Disposition: A | Discharge status: Home / Self Care | Payer: PPO | Source: Ambulatory Visit | Attending: Family Medicine | Admitting: Family Medicine

## 2022-02-21 DIAGNOSIS — Z1231 Encounter for screening mammogram for malignant neoplasm of breast: Secondary | ICD-10-CM | POA: Insufficient documentation

## 2022-03-07 DIAGNOSIS — R739 Hyperglycemia, unspecified: Secondary | ICD-10-CM | POA: Diagnosis not present

## 2022-03-07 DIAGNOSIS — E7800 Pure hypercholesterolemia, unspecified: Secondary | ICD-10-CM | POA: Diagnosis not present

## 2022-03-07 DIAGNOSIS — Z79899 Other long term (current) drug therapy: Secondary | ICD-10-CM | POA: Diagnosis not present

## 2022-03-14 DIAGNOSIS — Z1331 Encounter for screening for depression: Secondary | ICD-10-CM | POA: Diagnosis not present

## 2022-03-14 DIAGNOSIS — Z0000 Encounter for general adult medical examination without abnormal findings: Secondary | ICD-10-CM | POA: Diagnosis not present

## 2022-09-07 DIAGNOSIS — Z79899 Other long term (current) drug therapy: Secondary | ICD-10-CM | POA: Diagnosis not present

## 2022-09-07 DIAGNOSIS — R739 Hyperglycemia, unspecified: Secondary | ICD-10-CM | POA: Diagnosis not present

## 2022-09-07 DIAGNOSIS — E7800 Pure hypercholesterolemia, unspecified: Secondary | ICD-10-CM | POA: Diagnosis not present

## 2022-09-12 ENCOUNTER — Ambulatory Visit
Admission: EM | Admit: 2022-09-12 | Discharge: 2022-09-12 | Disposition: A | Discharge status: Home / Self Care | Payer: HMO | Attending: Emergency Medicine | Admitting: Emergency Medicine

## 2022-09-12 ENCOUNTER — Ambulatory Visit (INDEPENDENT_AMBULATORY_CARE_PROVIDER_SITE_OTHER): Payer: HMO

## 2022-09-12 DIAGNOSIS — R600 Localized edema: Secondary | ICD-10-CM | POA: Diagnosis not present

## 2022-09-12 DIAGNOSIS — S82031A Displaced transverse fracture of right patella, initial encounter for closed fracture: Secondary | ICD-10-CM | POA: Diagnosis not present

## 2022-09-12 DIAGNOSIS — M25469 Effusion, unspecified knee: Secondary | ICD-10-CM

## 2022-09-12 MED ORDER — HYDROCODONE-ACETAMINOPHEN 5-325 MG PO TABS: 1.0000 | ORAL_TABLET | Freq: Four times a day (QID) | ORAL | 0 refills | Status: AC | PRN

## 2022-09-12 MED ORDER — HYDROCODONE-ACETAMINOPHEN 5-325 MG PO TABS: 1.0000 | ORAL_TABLET | Freq: Four times a day (QID) | ORAL | 0 refills | Status: DC | PRN

## 2022-09-12 NOTE — ED Triage Notes (Signed)
Pt c/o fall today at 1:30pm  Pt states that she tripped over an item her cats placed in the kitchen.   Pt states that she can barely move her right leg.   Pt states that she has swelling and pain in the knee and the upper thigh going to the hip.

## 2022-09-12 NOTE — Discharge Instructions (Signed)
You have fractured your kneecap and you have a large amount of fluid/blood in your knee.  Please follow-up with [facility] tomorrow at the urgent care so that you can get into their system.  I suspect this may need surgery.  Take 1/2-1 Norco every 6 hours as needed for pain.  Ice.  Wear the knee immobilizer until you are seen by orthopedics tomorrow.  Go to the ER for the signs and symptoms we discussed.

## 2022-09-12 NOTE — ED Provider Notes (Signed)
HPI  SUBJECTIVE:  Pheobe Baymon is a 72 y.o. female who presents with constant anterior right knee pain, swelling, inability to move it after tripping and falling on a cat toy earlier today, landing directly onto her patella.  She denies hitting her head, loss of consciousness.  She states that the pain radiates up to her hip, she is unable to bear weight on it or move her knee at all.  No bruising erythema, increased temperature.  She denies injury to the hip, ankle or foot.  She tried elevation, ice.  No alleviating factors.  Symptoms are worse with trying to move it or weight-bear.  She has a past medical history of chronic kidney disease, IBS, osteoarthritis.  No history of osteoporosis, anticoagulant/antiplatelet use, history of right knee injury.  [facility].  Orthopedics: None.   Past Medical History:  Diagnosis Date   Asthma    Chronic kidney disease    Depression    GERD (gastroesophageal reflux disease)    High cholesterol    History of uterine leiomyoma    HLD (hyperlipidemia)    Hypertension    IBS (irritable bowel syndrome)    Pleural effusion    Rosacea    Tremor    Tremors of nervous system    Vitamin D deficiency     Past Surgical History:  Procedure Laterality Date   ABDOMINAL HYSTERECTOMY     colon polyps     COLONOSCOPY WITH PROPOFOL N/A 02/09/2016   Procedure: COLONOSCOPY WITH PROPOFOL;  Surgeon: Scot Jun, MD;  Location: [facility] ENDOSCOPY;  Service: Endoscopy;  Laterality: N/A;   COLONOSCOPY WITH PROPOFOL N/A 09/13/2021   Procedure: COLONOSCOPY WITH PROPOFOL;  Surgeon: Regis Bill, MD;  Location: [facility] ENDOSCOPY;  Service: Endoscopy;  Laterality: N/A;   GANGLION CYST EXCISION     HERNIA REPAIR     TONSILLECTOMY      Family History  Problem Relation Age of Onset   Breast cancer Neg Hx     Social History   Tobacco Use   Smoking status: Never   Smokeless tobacco: Never  Vaping Use   Vaping Use: Never used  Substance Use Topics    Alcohol use: No   Drug use: No    No current facility-administered medications for this encounter.  Current Outpatient Medications:    albuterol (VENTOLIN HFA) 108 (90 Base) MCG/ACT inhaler, Inhale 2 puffs into the lungs every 6 (six) hours as needed., Disp: , Rfl:    APPLE CIDER VINEGAR PO, Take by mouth., Disp: , Rfl:    ascorbic acid (VITAMIN C) 1000 MG tablet, Take by mouth., Disp: , Rfl:    aspirin 81 MG tablet, Take 81 mg by mouth daily., Disp: , Rfl:    aspirin EC 81 MG tablet, Take by mouth., Disp: , Rfl:    B Complex-C-Iron TABS, Take by mouth., Disp: , Rfl:    bifidobacterium infantis (ALIGN) capsule, Take 1 capsule by mouth daily., Disp: , Rfl:    Cholecalciferol 25 MCG (1000 UT) tablet, Take by mouth., Disp: , Rfl:    Cinnamon 500 MG capsule, Take by mouth., Disp: , Rfl:    Cranberry 400 MG CAPS, Take by mouth., Disp: , Rfl:    escitalopram (LEXAPRO) 20 MG tablet, Take 20 mg by mouth daily., Disp: , Rfl:    escitalopram (LEXAPRO) 20 MG tablet, Take 1 tablet by mouth daily., Disp: , Rfl:    famotidine (PEPCID) 10 MG tablet, Take 10 mg by mouth 2 (  two) times daily., Disp: , Rfl:    fluticasone (FLONASE) 50 MCG/ACT nasal spray, Place into the nose., Disp: , Rfl:    fluticasone (FLOVENT HFA) 110 MCG/ACT inhaler, Inhale 1 puff into the lungs 2 (two) times daily., Disp: , Rfl:    HYDROcodone-acetaminophen (NORCO/VICODIN) 5-325 MG tablet, Take 1 tablet by mouth every 6 (six) hours as needed for moderate pain or severe pain., Disp: 12 tablet, Rfl: 0   hyoscyamine (LEVSIN SL) 0.125 MG SL tablet, Place 0.125 mg under the tongue every 4 (four) hours as needed., Disp: , Rfl:    hyoscyamine (LEVSIN) 0.125 MG tablet, Take by mouth., Disp: , Rfl:    Magnesium 200 MG TABS, Take by mouth., Disp: , Rfl:    Multiple Vitamin (MULTI-VITAMIN) tablet, Take 1 tablet by mouth daily., Disp: , Rfl:    Multiple Vitamins-Minerals (OCUVITE ADULT 50+) CAPS, Take by mouth., Disp: , Rfl:    Na Sulfate-K  Sulfate-Mg Sulf 17.5-3.13-1.6 GM/177ML SOLN, Take by mouth., Disp: , Rfl:    Omega-3 Fatty Acids (FISH OIL) 1000 MG CAPS, Take by mouth., Disp: , Rfl:    omeprazole (PRILOSEC) 20 MG capsule, Take 20 mg by mouth daily., Disp: , Rfl:    omeprazole (PRILOSEC) 20 MG capsule, Take by mouth., Disp: , Rfl:    propranolol (INDERAL) 80 MG tablet, Take 80 mg by mouth 3 (three) times daily., Disp: , Rfl:    propranolol (INNOPRAN XL) 80 MG 24 hr capsule, Take 1 capsule by mouth daily., Disp: , Rfl:    psyllium (KONSYL) 33 % POWD, Take by mouth., Disp: , Rfl:    simvastatin (ZOCOR) 20 MG tablet, Take 20 mg by mouth daily., Disp: , Rfl:    simvastatin (ZOCOR) 20 MG tablet, Take 1 tablet by mouth daily., Disp: , Rfl:    traZODone (DESYREL) 50 MG tablet, Take 50 mg by mouth at bedtime., Disp: , Rfl:    traZODone (DESYREL) 50 MG tablet, Take 1 tablet by mouth at bedtime., Disp: , Rfl:    albuterol (VENTOLIN HFA) 108 (90 Base) MCG/ACT inhaler, Inhale into the lungs., Disp: , Rfl:    Calcium Carbonate-Vitamin D 600-5 MG-MCG TABS, Take by mouth., Disp: , Rfl:   Allergies  Allergen Reactions   Cephalosporins Rash   Sulfa Antibiotics Rash and Swelling     ROS  As noted in HPI.   Physical Exam  BP 120/77 (BP Location: Left Arm)   Pulse 61   Temp 98.9 F (37.2 C) (Oral)   Ht 5\' 5"  (1.651 m)   Wt 72.6 kg   SpO2 97%   BMI 26.63 kg/m   Constitutional: Well developed, well nourished, no acute distress Eyes:  EOMI, conjunctiva normal bilaterally HENT: Normocephalic, atraumatic,mucus membranes moist Respiratory: Normal inspiratory effort Cardiovascular: Normal rate GI: nondistended skin: No rash, skin intact Musculoskeletal:  R Knee: Range of motion extremely limited due to pain, Flexion  intact, unable to extend, Patella tender,  Patellar tendon tender, Medial joint NT, Lateral joint NT , Popliteal region tender, deferred range of motion testing due to patient pain, distal NVI with intact baseline  sensation / motor / pulse distal to knee.  Large effusion. No erythema. No increased temperature.        Neurologic: Alert & oriented x 3, no focal neuro deficits Psychiatric: Speech and behavior appropriate   ED Course   Medications - No data to display  Orders Placed This Encounter  Procedures   DG Knee AP/LAT W/Sunrise Right  Standing Status:   Standing    Number of Occurrences:   1    Order Specific Question:   Reason for Exam (SYMPTOM  OR DIAGNOSIS REQUIRED)    Answer:   Fall directly onto the knee.  Positive large effusion.  Rule out fracture, dislocation   Apply knee immobilizer    Standing Status:   Standing    Number of Occurrences:   1    Order Specific Question:   Laterality    Answer:   Right    Order Specific Question:   Knee Immobilizer Instruction    Answer:   At all times except when in CPM/PT and when providing other necessary care.    No results found for this or any previous visit (from the past 24 hour(s)). DG Knee AP/LAT W/Sunrise Right  Result Date: 09/12/2022 CLINICAL DATA:  Fall directly onto the knee. Positive large effusion. Rule out fracture, dislocation EXAM: RIGHT KNEE 3 VIEWS COMPARISON:  None Available. FINDINGS: Transverse fracture through the patella. Associated large joint effusion. No dislocation. Tricompartmental mild degenerative changes of the knee. Fat stranding of Hoffa's fat pad. Subcutaneus soft tissue edema anteriorly. IMPRESSION: 1. Acute transverse fracture through the patella. 2. Associated large joint effusion. Electronically Signed   By: Tish Frederickson M.D.   On: 09/12/2022 18:07    ED Clinical Impression  1. Closed displaced transverse fracture of right patella, initial encounter   2. Traumatic effusion of knee joint      ED Assessment/Plan      [place] Narcotic database reviewed for this patient, and feel that the risk/benefit ratio today is favorable for proceeding with a prescription for controlled substance.  No  opiate prescriptions in the past 2 years.  Reviewed imaging independently.  Transverse fracture through the patella.  Large effusion.  Fat stranding Hoffa's fat pad, subcutaneous soft tissue edema anteriorly.  See radiology report for full details.  Patient presents with a transverse patellar fracture and large effusion.  Placing a knee immobilizer, sending home with Norco 1/2 to 1 tablet every 6 hours.  Ice.  Follow-up with [facility] tonight or tomorrow at the urgent care to get her reevaluated.  I suspect this may require surgery.  ER return precautions given.  Discussed imaging, MDM, treatment plan, and plan for follow-up with patient. Discussed sn/sx that should prompt return to the ED. patient agrees with plan.   Meds ordered this encounter  Medications   HYDROcodone-acetaminophen (NORCO/VICODIN) 5-325 MG tablet    Sig: Take 1 tablet by mouth every 6 (six) hours as needed for moderate pain or severe pain.    Dispense:  12 tablet    Refill:  0      *This clinic note was created using Scientist, clinical (histocompatibility and immunogenetics). Therefore, there may be occasional mistakes despite careful proofreading.  ?    Domenick Gong, MD 09/12/22 1819

## 2022-09-15 DIAGNOSIS — S82001A Unspecified fracture of right patella, initial encounter for closed fracture: Secondary | ICD-10-CM | POA: Diagnosis not present

## 2022-09-29 DIAGNOSIS — S82001A Unspecified fracture of right patella, initial encounter for closed fracture: Secondary | ICD-10-CM | POA: Diagnosis not present

## 2022-10-23 DIAGNOSIS — S82001A Unspecified fracture of right patella, initial encounter for closed fracture: Secondary | ICD-10-CM | POA: Diagnosis not present

## 2022-11-02 DIAGNOSIS — N1831 Chronic kidney disease, stage 3a: Secondary | ICD-10-CM | POA: Diagnosis not present

## 2022-11-02 DIAGNOSIS — R739 Hyperglycemia, unspecified: Secondary | ICD-10-CM | POA: Diagnosis not present

## 2022-11-02 DIAGNOSIS — F3341 Major depressive disorder, recurrent, in partial remission: Secondary | ICD-10-CM | POA: Diagnosis not present

## 2022-11-02 DIAGNOSIS — E7800 Pure hypercholesterolemia, unspecified: Secondary | ICD-10-CM | POA: Diagnosis not present

## 2022-11-02 DIAGNOSIS — R7303 Prediabetes: Secondary | ICD-10-CM | POA: Diagnosis not present

## 2022-11-02 DIAGNOSIS — I10 Essential (primary) hypertension: Secondary | ICD-10-CM | POA: Diagnosis not present

## 2022-11-02 DIAGNOSIS — J4520 Mild intermittent asthma, uncomplicated: Secondary | ICD-10-CM | POA: Diagnosis not present

## 2022-11-10 DIAGNOSIS — S82001A Unspecified fracture of right patella, initial encounter for closed fracture: Secondary | ICD-10-CM | POA: Diagnosis not present

## 2022-11-29 ENCOUNTER — Encounter (INDEPENDENT_AMBULATORY_CARE_PROVIDER_SITE_OTHER): Payer: HMO | Admitting: Ophthalmology

## 2022-11-29 DIAGNOSIS — H35371 Puckering of macula, right eye: Secondary | ICD-10-CM

## 2022-11-29 DIAGNOSIS — H43813 Vitreous degeneration, bilateral: Secondary | ICD-10-CM | POA: Diagnosis not present

## 2022-11-29 DIAGNOSIS — H35342 Macular cyst, hole, or pseudohole, left eye: Secondary | ICD-10-CM | POA: Diagnosis not present

## 2022-11-30 DIAGNOSIS — E663 Overweight: Secondary | ICD-10-CM | POA: Diagnosis not present

## 2022-11-30 DIAGNOSIS — G250 Essential tremor: Secondary | ICD-10-CM | POA: Diagnosis not present

## 2022-11-30 DIAGNOSIS — H442B9 Degenerative myopia with macular hole, unspecified eye: Secondary | ICD-10-CM | POA: Diagnosis not present

## 2022-11-30 DIAGNOSIS — J45909 Unspecified asthma, uncomplicated: Secondary | ICD-10-CM | POA: Diagnosis not present

## 2022-11-30 DIAGNOSIS — K649 Unspecified hemorrhoids: Secondary | ICD-10-CM | POA: Diagnosis not present

## 2022-11-30 DIAGNOSIS — K219 Gastro-esophageal reflux disease without esophagitis: Secondary | ICD-10-CM | POA: Diagnosis not present

## 2022-11-30 DIAGNOSIS — F3341 Major depressive disorder, recurrent, in partial remission: Secondary | ICD-10-CM | POA: Diagnosis not present

## 2022-11-30 DIAGNOSIS — G4700 Insomnia, unspecified: Secondary | ICD-10-CM | POA: Diagnosis not present

## 2022-11-30 DIAGNOSIS — E785 Hyperlipidemia, unspecified: Secondary | ICD-10-CM | POA: Diagnosis not present

## 2022-11-30 DIAGNOSIS — N1831 Chronic kidney disease, stage 3a: Secondary | ICD-10-CM | POA: Diagnosis not present

## 2022-11-30 DIAGNOSIS — G8929 Other chronic pain: Secondary | ICD-10-CM | POA: Diagnosis not present

## 2022-11-30 DIAGNOSIS — K589 Irritable bowel syndrome without diarrhea: Secondary | ICD-10-CM | POA: Diagnosis not present

## 2023-01-04 DIAGNOSIS — M8588 Other specified disorders of bone density and structure, other site: Secondary | ICD-10-CM | POA: Diagnosis not present

## 2023-03-01 ENCOUNTER — Other Ambulatory Visit: Payer: Self-pay | Admitting: Family Medicine

## 2023-03-01 DIAGNOSIS — Z1231 Encounter for screening mammogram for malignant neoplasm of breast: Secondary | ICD-10-CM

## 2023-03-20 ENCOUNTER — Ambulatory Visit
Admission: RE | Admit: 2023-03-20 | Discharge: 2023-03-20 | Disposition: A | Discharge status: Home / Self Care | Payer: HMO | Source: Ambulatory Visit | Attending: Family Medicine | Admitting: Family Medicine

## 2023-03-20 DIAGNOSIS — Z1231 Encounter for screening mammogram for malignant neoplasm of breast: Secondary | ICD-10-CM | POA: Diagnosis not present

## 2023-05-01 DIAGNOSIS — E7800 Pure hypercholesterolemia, unspecified: Secondary | ICD-10-CM | POA: Diagnosis not present

## 2023-05-01 DIAGNOSIS — R7303 Prediabetes: Secondary | ICD-10-CM | POA: Diagnosis not present

## 2023-05-01 DIAGNOSIS — R739 Hyperglycemia, unspecified: Secondary | ICD-10-CM | POA: Diagnosis not present

## 2023-05-01 DIAGNOSIS — N1831 Chronic kidney disease, stage 3a: Secondary | ICD-10-CM | POA: Diagnosis not present

## 2023-05-08 DIAGNOSIS — F3341 Major depressive disorder, recurrent, in partial remission: Secondary | ICD-10-CM | POA: Diagnosis not present

## 2023-05-08 DIAGNOSIS — E7800 Pure hypercholesterolemia, unspecified: Secondary | ICD-10-CM | POA: Diagnosis not present

## 2023-05-08 DIAGNOSIS — Z1331 Encounter for screening for depression: Secondary | ICD-10-CM | POA: Diagnosis not present

## 2023-05-08 DIAGNOSIS — E210 Primary hyperparathyroidism: Secondary | ICD-10-CM | POA: Diagnosis not present

## 2023-05-08 DIAGNOSIS — N1831 Chronic kidney disease, stage 3a: Secondary | ICD-10-CM | POA: Diagnosis not present

## 2023-05-08 DIAGNOSIS — R7303 Prediabetes: Secondary | ICD-10-CM | POA: Diagnosis not present

## 2023-05-08 DIAGNOSIS — I10 Essential (primary) hypertension: Secondary | ICD-10-CM | POA: Diagnosis not present

## 2023-05-08 DIAGNOSIS — J4520 Mild intermittent asthma, uncomplicated: Secondary | ICD-10-CM | POA: Diagnosis not present

## 2023-05-08 DIAGNOSIS — Z0000 Encounter for general adult medical examination without abnormal findings: Secondary | ICD-10-CM | POA: Diagnosis not present

## 2023-10-23 DIAGNOSIS — H35342 Macular cyst, hole, or pseudohole, left eye: Secondary | ICD-10-CM | POA: Diagnosis not present

## 2023-10-23 DIAGNOSIS — H35373 Puckering of macula, bilateral: Secondary | ICD-10-CM | POA: Diagnosis not present

## 2023-10-23 DIAGNOSIS — Z961 Presence of intraocular lens: Secondary | ICD-10-CM | POA: Diagnosis not present

## 2023-10-29 DIAGNOSIS — E210 Primary hyperparathyroidism: Secondary | ICD-10-CM | POA: Diagnosis not present

## 2023-10-29 DIAGNOSIS — R7303 Prediabetes: Secondary | ICD-10-CM | POA: Diagnosis not present

## 2023-10-29 DIAGNOSIS — N1831 Chronic kidney disease, stage 3a: Secondary | ICD-10-CM | POA: Diagnosis not present

## 2023-10-29 DIAGNOSIS — E7800 Pure hypercholesterolemia, unspecified: Secondary | ICD-10-CM | POA: Diagnosis not present

## 2023-11-05 DIAGNOSIS — I10 Essential (primary) hypertension: Secondary | ICD-10-CM | POA: Diagnosis not present

## 2023-11-05 DIAGNOSIS — R7303 Prediabetes: Secondary | ICD-10-CM | POA: Diagnosis not present

## 2023-11-05 DIAGNOSIS — F3341 Major depressive disorder, recurrent, in partial remission: Secondary | ICD-10-CM | POA: Diagnosis not present

## 2023-11-05 DIAGNOSIS — J4520 Mild intermittent asthma, uncomplicated: Secondary | ICD-10-CM | POA: Diagnosis not present

## 2023-11-05 DIAGNOSIS — N1831 Chronic kidney disease, stage 3a: Secondary | ICD-10-CM | POA: Diagnosis not present

## 2023-11-05 DIAGNOSIS — E7800 Pure hypercholesterolemia, unspecified: Secondary | ICD-10-CM | POA: Diagnosis not present

## 2023-11-29 ENCOUNTER — Encounter (INDEPENDENT_AMBULATORY_CARE_PROVIDER_SITE_OTHER): Payer: HMO | Admitting: Ophthalmology

## 2023-11-29 DIAGNOSIS — H35342 Macular cyst, hole, or pseudohole, left eye: Secondary | ICD-10-CM | POA: Diagnosis not present

## 2023-11-29 DIAGNOSIS — H43813 Vitreous degeneration, bilateral: Secondary | ICD-10-CM | POA: Diagnosis not present

## 2023-11-29 DIAGNOSIS — H35373 Puckering of macula, bilateral: Secondary | ICD-10-CM | POA: Diagnosis not present

## 2024-04-22 ENCOUNTER — Other Ambulatory Visit: Payer: Self-pay | Admitting: Family Medicine

## 2024-04-22 DIAGNOSIS — Z1231 Encounter for screening mammogram for malignant neoplasm of breast: Secondary | ICD-10-CM

## 2024-04-28 ENCOUNTER — Ambulatory Visit
Admission: RE | Admit: 2024-04-28 | Discharge: 2024-04-28 | Disposition: A | Discharge status: Home / Self Care | Source: Ambulatory Visit | Attending: Family Medicine | Admitting: Family Medicine

## 2024-04-28 DIAGNOSIS — Z1231 Encounter for screening mammogram for malignant neoplasm of breast: Secondary | ICD-10-CM | POA: Insufficient documentation

## 2024-12-05 ENCOUNTER — Encounter (INDEPENDENT_AMBULATORY_CARE_PROVIDER_SITE_OTHER): Admitting: Ophthalmology
